# Patient Record
Sex: Male | Born: 1960
Health system: Southern US, Community
[De-identification: ages and names within clinical notes are randomized; demographics above are authoritative.]

## PROBLEM LIST (undated history)

## (undated) DIAGNOSIS — F419 Anxiety disorder, unspecified: Secondary | ICD-10-CM

## (undated) DIAGNOSIS — F32A Depression, unspecified: Secondary | ICD-10-CM

## (undated) DIAGNOSIS — K219 Gastro-esophageal reflux disease without esophagitis: Secondary | ICD-10-CM

## (undated) DIAGNOSIS — E039 Hypothyroidism, unspecified: Secondary | ICD-10-CM

## (undated) DIAGNOSIS — T7840XA Allergy, unspecified, initial encounter: Secondary | ICD-10-CM

## (undated) DIAGNOSIS — E785 Hyperlipidemia, unspecified: Secondary | ICD-10-CM

## (undated) DIAGNOSIS — F329 Major depressive disorder, single episode, unspecified: Secondary | ICD-10-CM

## (undated) DIAGNOSIS — M1990 Unspecified osteoarthritis, unspecified site: Secondary | ICD-10-CM

## (undated) HISTORY — PX: KNEE ARTHROSCOPY: SUR90

## (undated) HISTORY — PX: NASAL STENOSIS REPAIR: SHX2071

## (undated) HISTORY — PX: SEPTOPLASTY: SUR1290

## (undated) HISTORY — DX: Hyperlipidemia, unspecified: E78.5

## (undated) HISTORY — DX: Depression, unspecified: F32.A

## (undated) HISTORY — DX: Unspecified osteoarthritis, unspecified site: M19.90

## (undated) HISTORY — DX: Anxiety disorder, unspecified: F41.9

## (undated) HISTORY — DX: Gastro-esophageal reflux disease without esophagitis: K21.9

## (undated) HISTORY — DX: Allergy, unspecified, initial encounter: T78.40XA

## (undated) HISTORY — DX: Major depressive disorder, single episode, unspecified: F32.9

## (undated) HISTORY — DX: Hypothyroidism, unspecified: E03.9

## (undated) HISTORY — PX: ROTATOR CUFF REPAIR: SHX139

---

## 1999-02-04 ENCOUNTER — Encounter: Payer: Self-pay | Admitting: Internal Medicine

## 2004-04-30 ENCOUNTER — Ambulatory Visit: Payer: Self-pay | Admitting: Internal Medicine

## 2004-09-03 ENCOUNTER — Ambulatory Visit: Payer: Self-pay | Admitting: Orthopaedic Surgery

## 2004-09-23 ENCOUNTER — Ambulatory Visit: Payer: Self-pay | Admitting: Orthopaedic Surgery

## 2006-05-20 ENCOUNTER — Ambulatory Visit: Payer: Self-pay | Admitting: Orthopaedic Surgery

## 2006-06-01 ENCOUNTER — Ambulatory Visit: Payer: Self-pay | Admitting: Orthopaedic Surgery

## 2006-09-16 ENCOUNTER — Ambulatory Visit: Payer: Self-pay | Admitting: Orthopaedic Surgery

## 2006-10-20 ENCOUNTER — Ambulatory Visit: Payer: Self-pay | Admitting: Internal Medicine

## 2006-10-20 LAB — CONVERTED CEMR LAB: Glucose, Bld: 102 mg/dL — ABNORMAL HIGH (ref 70–99)

## 2007-01-27 ENCOUNTER — Telehealth (INDEPENDENT_AMBULATORY_CARE_PROVIDER_SITE_OTHER): Payer: Self-pay | Admitting: *Deleted

## 2007-04-13 ENCOUNTER — Telehealth (INDEPENDENT_AMBULATORY_CARE_PROVIDER_SITE_OTHER): Payer: Self-pay | Admitting: *Deleted

## 2007-04-21 ENCOUNTER — Ambulatory Visit: Payer: Self-pay | Admitting: Internal Medicine

## 2007-04-23 LAB — CONVERTED CEMR LAB
Barbiturate Quant, Ur: NEGATIVE
Cocaine Metabolites: NEGATIVE
Creatinine,U: 47.9 mg/dL
Phencyclidine (PCP): NEGATIVE
Propoxyphene: NEGATIVE

## 2007-04-29 ENCOUNTER — Telehealth (INDEPENDENT_AMBULATORY_CARE_PROVIDER_SITE_OTHER): Payer: Self-pay | Admitting: *Deleted

## 2007-04-30 ENCOUNTER — Ambulatory Visit: Payer: Self-pay | Admitting: Internal Medicine

## 2007-05-03 LAB — CONVERTED CEMR LAB: Rubella: <0.2 intl units/mL

## 2007-08-05 ENCOUNTER — Ambulatory Visit: Payer: Self-pay | Admitting: Internal Medicine

## 2007-08-26 ENCOUNTER — Ambulatory Visit: Payer: Self-pay | Admitting: Internal Medicine

## 2007-10-15 ENCOUNTER — Ambulatory Visit: Payer: Self-pay | Admitting: Internal Medicine

## 2008-02-02 ENCOUNTER — Ambulatory Visit: Payer: Self-pay | Admitting: Internal Medicine

## 2008-02-02 DIAGNOSIS — F39 Unspecified mood [affective] disorder: Secondary | ICD-10-CM | POA: Insufficient documentation

## 2008-02-02 DIAGNOSIS — M1990 Unspecified osteoarthritis, unspecified site: Secondary | ICD-10-CM | POA: Insufficient documentation

## 2008-02-02 DIAGNOSIS — J309 Allergic rhinitis, unspecified: Secondary | ICD-10-CM | POA: Insufficient documentation

## 2008-02-02 DIAGNOSIS — R5383 Other fatigue: Secondary | ICD-10-CM

## 2008-02-02 DIAGNOSIS — K219 Gastro-esophageal reflux disease without esophagitis: Secondary | ICD-10-CM

## 2008-02-02 DIAGNOSIS — J45909 Unspecified asthma, uncomplicated: Secondary | ICD-10-CM

## 2008-02-02 DIAGNOSIS — R5381 Other malaise: Secondary | ICD-10-CM | POA: Insufficient documentation

## 2008-02-03 LAB — CONVERTED CEMR LAB
Sex Hormone Binding: 32 nmol/L (ref 13–71)
Testosterone Free: 53.5 pg/mL (ref 47.0–244.0)
Testosterone-% Free: 2 % (ref 1.6–2.9)
Testosterone: 270 ng/dL — ABNORMAL LOW (ref 350–890)

## 2008-02-04 LAB — CONVERTED CEMR LAB
Alkaline Phosphatase: 48 units/L (ref 39–117)
BUN: 17 mg/dL (ref 6–23)
Basophils Absolute: 0 10*3/uL (ref 0.0–0.1)
Bilirubin, Direct: 0.1 mg/dL (ref 0.0–0.3)
CO2: 27 meq/L (ref 19–32)
Chloride: 100 meq/L (ref 96–112)
Creatinine, Ser: 1.2 mg/dL (ref 0.4–1.5)
Eosinophils Absolute: 0.1 10*3/uL (ref 0.0–0.7)
Eosinophils Relative: 0.8 % (ref 0.0–5.0)
GFR calc Af Amer: 83 mL/min
Glucose, Bld: 96 mg/dL (ref 70–99)
HCT: 46.2 % (ref 39.0–52.0)
MCV: 94.8 fL (ref 78.0–100.0)
Monocytes Absolute: 0.5 10*3/uL (ref 0.1–1.0)
Neutrophils Relative %: 76.4 % (ref 43.0–77.0)
Platelets: 223 10*3/uL (ref 150–400)
Potassium: 4.4 meq/L (ref 3.5–5.1)
RDW: 12.2 % (ref 11.5–14.6)
TSH: 0.91 microintl units/mL (ref 0.35–5.50)
Total Bilirubin: 1.2 mg/dL (ref 0.3–1.2)
Total Protein: 7.5 g/dL (ref 6.0–8.3)
WBC: 8.9 10*3/uL (ref 4.5–10.5)

## 2008-05-02 ENCOUNTER — Encounter: Payer: Self-pay | Admitting: Internal Medicine

## 2008-06-14 ENCOUNTER — Telehealth: Payer: Self-pay | Admitting: Internal Medicine

## 2008-09-28 ENCOUNTER — Ambulatory Visit: Payer: Self-pay | Admitting: Otolaryngology

## 2008-10-31 ENCOUNTER — Telehealth: Payer: Self-pay | Admitting: Internal Medicine

## 2008-10-31 ENCOUNTER — Ambulatory Visit: Payer: Self-pay | Admitting: Internal Medicine

## 2008-11-14 ENCOUNTER — Ambulatory Visit: Payer: Self-pay | Admitting: Internal Medicine

## 2008-11-14 ENCOUNTER — Telehealth: Payer: Self-pay | Admitting: Internal Medicine

## 2008-11-16 ENCOUNTER — Encounter (INDEPENDENT_AMBULATORY_CARE_PROVIDER_SITE_OTHER): Payer: Self-pay | Admitting: *Deleted

## 2008-11-16 LAB — CONVERTED CEMR LAB: Varicella IgG: 2.12 — ABNORMAL HIGH

## 2008-12-20 ENCOUNTER — Telehealth: Payer: Self-pay | Admitting: Internal Medicine

## 2009-02-05 ENCOUNTER — Telehealth: Payer: Self-pay | Admitting: Internal Medicine

## 2009-03-01 ENCOUNTER — Ambulatory Visit: Payer: Self-pay | Admitting: Internal Medicine

## 2009-03-01 DIAGNOSIS — F3289 Other specified depressive episodes: Secondary | ICD-10-CM | POA: Insufficient documentation

## 2009-03-01 DIAGNOSIS — F329 Major depressive disorder, single episode, unspecified: Secondary | ICD-10-CM

## 2009-04-17 ENCOUNTER — Ambulatory Visit: Payer: Self-pay | Admitting: Internal Medicine

## 2009-04-18 LAB — CONVERTED CEMR LAB
BUN: 20 mg/dL (ref 6–23)
Basophils Absolute: 0 10*3/uL (ref 0.0–0.1)
Chloride: 101 meq/L (ref 96–112)
Creatinine, Ser: 1 mg/dL (ref 0.4–1.5)
Eosinophils Absolute: 0.1 10*3/uL (ref 0.0–0.7)
GFR calc non Af Amer: 84.67 mL/min (ref >60)
Hemoglobin: 16.3 g/dL (ref 13.0–17.0)
Lymphocytes Relative: 35.9 % (ref 12.0–46.0)
MCHC: 34.7 g/dL (ref 30.0–36.0)
Monocytes Relative: 7.3 % (ref 3.0–12.0)
Neutro Abs: 2.6 10*3/uL (ref 1.4–7.7)
Phosphorus: 3.8 mg/dL (ref 2.3–4.6)
Platelets: 167 10*3/uL (ref 150.0–400.0)
RDW: 11.9 % (ref 11.5–14.6)
Sodium: 140 meq/L (ref 135–145)
TSH: 1.2 microintl units/mL (ref 0.35–5.50)

## 2009-05-31 DIAGNOSIS — D239 Other benign neoplasm of skin, unspecified: Secondary | ICD-10-CM

## 2009-05-31 HISTORY — DX: Other benign neoplasm of skin, unspecified: D23.9

## 2009-06-01 ENCOUNTER — Telehealth: Payer: Self-pay | Admitting: Internal Medicine

## 2009-06-30 DIAGNOSIS — E039 Hypothyroidism, unspecified: Secondary | ICD-10-CM

## 2009-06-30 HISTORY — DX: Hypothyroidism, unspecified: E03.9

## 2009-11-06 ENCOUNTER — Telehealth: Payer: Self-pay | Admitting: Internal Medicine

## 2009-12-10 ENCOUNTER — Ambulatory Visit: Payer: Self-pay | Admitting: Internal Medicine

## 2009-12-24 ENCOUNTER — Telehealth: Payer: Self-pay | Admitting: Internal Medicine

## 2010-01-07 ENCOUNTER — Telehealth: Payer: Self-pay | Admitting: Internal Medicine

## 2010-02-05 ENCOUNTER — Ambulatory Visit: Payer: Self-pay | Admitting: Internal Medicine

## 2010-02-11 ENCOUNTER — Ambulatory Visit: Payer: Self-pay | Admitting: Internal Medicine

## 2010-04-09 ENCOUNTER — Ambulatory Visit: Payer: Self-pay | Admitting: Internal Medicine

## 2010-04-25 ENCOUNTER — Telehealth: Payer: Self-pay | Admitting: Internal Medicine

## 2010-04-30 ENCOUNTER — Ambulatory Visit: Payer: Self-pay | Admitting: Internal Medicine

## 2010-04-30 DIAGNOSIS — R109 Unspecified abdominal pain: Secondary | ICD-10-CM | POA: Insufficient documentation

## 2010-07-05 ENCOUNTER — Encounter: Payer: Self-pay | Admitting: Family Medicine

## 2010-07-30 NOTE — Assessment & Plan Note (Signed)
Summary: TB TEST/Coralynn Gaona/CLE  Nurse Visit   Allergies: No Known Drug Allergies  Immunizations Administered:  PPD Skin Test:    Vaccine Type: PPD    Site: left forearm    Mfr: Sanofi Pasteur    Dose: 0.1 ml    Route: ID    Given by: Sydell Axon LPN    Exp. Date: 04/12/2011    Lot #: [id number]  PPD Results    Date of reading: 02/08/2010    Results: < 5mm    Interpretation: negative  Orders Added: 1)  TB Skin Test [86580] 2)  Admin 1st Vaccine [45409]

## 2010-07-30 NOTE — Progress Notes (Signed)
Summary: wants lab work  Phone Note Call from Patient Call back at [employer] [phone]   Caller: Patient Call For: Cindee Salt MD Summary of Call: Pt is asking if he can have lab work to check for ciliac disease.   Initial call taken by: Lowella Petties CMA, AAMA,  April 25, 2010 1:11 PM  Follow-up for Phone Call        okay to set up check for tissue transglutaminase and anti gliadin IgA)  Dx   789.00 Follow-up by: Cindee Salt MD,  April 25, 2010 1:20 PM  Additional Follow-up for Phone Call Additional follow up Details #1::        [facility] for pt to call.           Lowella Petties CMA, AAMA  April 26, 2010 10:20 AM  Lab appt made. Additional Follow-up by: Lowella Petties CMA, AAMA,  April 30, 2010 11:00 AM

## 2010-07-30 NOTE — Assessment & Plan Note (Signed)
Summary: flu shot/letvak/dlo  Nurse Visit   Allergies: 1)  ! Augmentin (Amoxicillin-Pot Clavulanate) 2)  ! * Peanuts  Orders Added: 1)  Admin 1st Vaccine [90471] 2)  Flu Vaccine 67yrs + [[address]]  Flu Vaccine Consent Questions     Do you have a history of severe allergic reactions to this vaccine? no    Any prior history of allergic reactions to egg and/or gelatin? no    Do you have a sensitivity to the preservative Thimersol? no    Do you have a past history of Guillan-Barre Syndrome? no    Do you currently have an acute febrile illness? no    Have you ever had a severe reaction to latex? no    Vaccine information given and explained to patient? yes    Are you currently pregnant? no    Lot Number:[id number]   Exp Date:12/28/2010   Site Given  Left Deltoid IM

## 2010-07-30 NOTE — Progress Notes (Signed)
Summary: Needs appt  Phone Note Outgoing Call   Call placed by: Mervin Hack CMA Duncan Dull),  Nov 06, 2009 1:05 PM Call placed to: Patient Summary of Call: called pt to advise that he needs to be seen for further refills, pt coming in today for ppd and he will schedule appt then. Will refill Initial call taken by: Mervin Hack CMA [facility]),  Nov 06, 2009 1:06 PM

## 2010-07-30 NOTE — Progress Notes (Signed)
Summary: Needs appt  Phone Note Call from Patient Call back at Home Phone [phone]   Caller: Patient Call For: Cindee Salt MD Summary of Call: patient calling to get refills, pt finally made appt on July 19 can I refill enough to last until that appt? normally pt makes appt get refill, then cancel appt. Also he wants rx for Prevacid, I advised that insurance may not coverPlease advise. Initial call taken by: Mervin Hack CMA [facility] Dull),  December 24, 2009 5:13 PM  Follow-up for Phone Call        okay to fill till appt #30 Follow-up by: Cindee Salt MD,  December 24, 2009 5:23 PM  Additional Follow-up for Phone Call Additional follow up Details #1::        Sent in prevacid, advised pt. Additional Follow-up by: Lowella Petties CMA,  December 25, 2009 2:51 PM    New/Updated Medications: PREVACID 30 MG CPDR (LANSOPRAZOLE) take one by mouth daily Prescriptions: LANSOPRAZOLE 30 MG CPDR (LANSOPRAZOLE) take 1 by mouth once daily  #30 x 0   Entered by:   Lowella Petties CMA   Authorized by:   Cindee Salt MD   Signed by:   Lowella Petties CMA on 12/25/2009   Method used:   Electronically to        [facility] [address]* (retail)       [address] [place], [place]  [address]       Ph: [phone]       Fax: [phone]   RxID:   [id number]   Prior Medications: LANSOPRAZOLE 30 MG CPDR (LANSOPRAZOLE) take 1 by mouth once daily VENTOLIN HFA 108 (90 BASE) MCG/ACT AERS (ALBUTEROL SULFATE) 2 puffs three times a day as needed for asthma problems CITALOPRAM HYDROBROMIDE 20 MG TABS (CITALOPRAM HYDROBROMIDE) 1 tab daily for anxiety and depression ANDROGEL 25 MG/2.5GM GEL (TESTOSTERONE) use once daily PREVACID 30 MG CPDR (LANSOPRAZOLE) take one by mouth daily Current Allergies: No known allergies

## 2010-07-30 NOTE — Assessment & Plan Note (Signed)
Summary: REFILL MEDICATION/CLE   Vital Signs:  Patient profile:   50 year old male Weight:      238 pounds BMI:     34.27 Temp:     98.6 degrees F oral Pulse rate:   64 / minute Pulse rhythm:   regular BP sitting:   128 / 80  (left arm) Cuff size:   large  Vitals Entered By: Mervin Hack CMA [facility]) (February 11, 2010 2:57 PM) CC: medication refill   History of Present Illness: Has been doing okay Continues on the citalopram one daily Had tried going off it briefly--for 3 days then multiple stressors and he really got worse  restarted and has been doing okay No depression  Still with knee pain he finds it limits his exercise  Asthma has been quiet  Reflux is quiet still on prevacid and that controls his symptoms His intestines do make a lot of noise then he gets gas has tried to cut out milk which may help some  started on pravachol for mildly elevated LDL Dr Gwen Pounds prescribed this  Allergies (verified): 1)  ! Augmentin (Amoxicillin-Pot Clavulanate) 2)  ! * Peanuts  Past History:  Past medical, surgical, family and social histories (including risk factors) reviewed for relevance to current acute and chronic problems.  Past Medical History: Reviewed history from 04/17/2009 and no changes required. Allergic rhinitis Anxiety Asthma GERD Osteoarthritis Depression Low testosterone--------------------------Dr Cope  Past Surgical History: Reviewed history from 03/01/2009 and no changes required. Rotator cuff surgeries---left 12/07, right 3/05 ([facility]) Right knee arthroscopy Septoplasty and nasal repair--4/10  Family History: Reviewed history from 03/01/2009 and no changes required. Mom died of cervical cancer @72  Dad died of renal failure @78 . Had  DM,HTN, CAD, had defibrillator 1 brother--obese Cancer on mom's side No colon or prostate cancer  Social History: Reviewed history from 03/01/2009 and no changes required. 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   Neck:  supple, no masses, no thyromegaly, no carotid bruits, and no cervical lymphadenopathy.   Lungs:  normal respiratory effort, no intercostal retractions, no accessory muscle use, and normal breath sounds.   Heart:  normal rate, regular rhythm, no murmur, and no gallop.   Abdomen:  soft, non-tender, and no masses.   Msk:  no joint tenderness and no joint swelling.   Pulses:  1+ in feet Extremities:  no edema Psych:  normally interactive, good eye contact, not anxious appearing, and not depressed appearing.     Impression & Recommendations:  Problem # 1:  ANXIETY (ICD-300.00) Assessment Unchanged doing okay on meds no changes  His updated medication list for this problem includes:    Citalopram Hydrobromide 20 Mg Tabs (Citalopram hydrobromide) .Marland Kitchen... 1 tab daily for anxiety and depression  Problem # 2:  OSTEOARTHRITIS (ICD-715.90) Assessment: Unchanged knees limit him some no regular meds though needs to work on fitness  Problem # 3:  ASTHMA (ICD-493.90) Assessment: Unchanged mild and intermittent  His updated medication list for this problem includes:    Ventolin Hfa 108 (90 Base) Mcg/act Aers (Albuterol sulfate) .Marland Kitchen... 2 puffs three times a day as needed for  asthma problems  Problem # 4:  GERD (ICD-530.81) Assessment: Unchanged okay on the meds  The following medications were removed from the medication list:    Prevacid 30 Mg Cpdr (Lansoprazole) .Marland Kitchen... Take one by mouth daily His updated medication list for this problem includes:    Lansoprazole 30 Mg Cpdr (Lansoprazole) .Marland Kitchen...  Take 1 by mouth once daily  Complete Medication List: 1)  Lansoprazole 30 Mg Cpdr (Lansoprazole) .... Take 1 by mouth once daily 2)  Ventolin Hfa 108 (90 Base) Mcg/act Aers (Albuterol sulfate) .... 2 puffs three times a day as needed for asthma problems 3)  Citalopram Hydrobromide 20 Mg Tabs (Citalopram hydrobromide) .Marland Kitchen.. 1 tab daily for anxiety and depression 4)  Androgel 25 Mg/2.5gm Gel (Testosterone) .... Use once daily 5)  Pravachol 40 Mg Tabs (Pravastatin sodium) .... Take 1 by mouth once daily  Patient Instructions: 1)  Please schedule a follow-up appointment in 6 months for physical Prescriptions: CITALOPRAM HYDROBROMIDE 20 MG TABS (CITALOPRAM HYDROBROMIDE) 1 tab daily for anxiety and depression  #30 x 12   Entered and Authorized by:   Cindee Salt MD   Signed by:   Cindee Salt MD on 02/11/2010   Method used:   Electronically to        [facility] [address] (retail)       [address] [place], [place]  [address]       Ph: [phone]       Fax: [phone]   RxID:   [id number] VENTOLIN HFA 108 (90 BASE) MCG/ACT AERS (ALBUTEROL SULFATE) 2 puffs three times a day as needed for asthma problems  #18 Gram x 3   Entered by:   Mervin Hack CMA (AAMA)   Authorized by:   Cindee Salt MD   Signed by:   Mervin Hack CMA (AAMA) on 02/11/2010   Method used:   Electronically to        [facility] [address]* (retail)       [address] [place], [place]  [address]       Ph: [phone]       Fax: [phone]   RxID:   [id number] LANSOPRAZOLE 30 MG CPDR (LANSOPRAZOLE) take 1 by mouth once daily  #90 x 3   Entered by:   Mervin Hack CMA (AAMA)   Authorized by:   Cindee Salt MD   Signed by:   Mervin Hack CMA (AAMA) on 02/11/2010   Method used:   Electronically to        [facility] [address] (retail)       [address] [place], [place]  [address]       Ph: [phone]       Fax: [phone]   RxID:   [id number]   Current Allergies (reviewed today): ! AUGMENTIN (AMOXICILLIN-POT CLAVULANATE) ! * PEANUTS

## 2010-07-30 NOTE — Progress Notes (Signed)
Summary:  CITALOPRAM   Phone Note Refill Request Message from:  [facility] #[id number] on January 07, 2010 12:31 PM  Refills Requested: Medication #1:  CITALOPRAM HYDROBROMIDE 20 MG TABS 1 tab daily for anxiety and depression   Last Refilled: 11/06/2009 E-Scribe Request pt has appt 01/15/2010   Method Requested: Electronic Initial call taken by: Mervin Hack CMA Duncan Dull),  January 07, 2010 12:31 PM  Follow-up for Phone Call        okay to fill #30 x 0 Follow-up by: Cindee Salt MD,  January 07, 2010 2:00 PM  Additional Follow-up for Phone Call Additional follow up Details #1::        Rx faxed to pharmacy Additional Follow-up by: DeShannon Smith CMA Duncan Dull),  January 07, 2010 3:25 PM    Prescriptions: CITALOPRAM HYDROBROMIDE 20 MG TABS (CITALOPRAM HYDROBROMIDE) 1 tab daily for anxiety and depression  #30 Tablet x 0   Entered by:   Mervin Hack CMA (AAMA)   Authorized by:   Cindee Salt MD   Signed by:   Mervin Hack CMA (AAMA) on 01/07/2010   Method used:   Electronically to        [facility] [address] [phone]* (retail)       [address] [place], [place]  [id number]       Ph: [phone]       Fax: [phone]   RxID:   [id number]

## 2010-07-30 NOTE — Assessment & Plan Note (Signed)
Summary: TB TEST/[facility]  Nurse Visit   Allergies: No Known Drug Allergies  Immunizations Administered:  PPD Skin Test:    Vaccine Type: PPD    Site: right forearm    Mfr: Sanofi Pasteur    Dose: 0.1 ml    Route: ID    Given by: Lowella Petties CMA    Exp. Date: 04/12/2011    Lot #: [id number]  Orders Added: 1)  TB Skin Test [86580] 2)  Admin 1st Vaccine [phone]

## 2010-08-12 ENCOUNTER — Encounter: Payer: Self-pay | Admitting: Internal Medicine

## 2010-08-12 ENCOUNTER — Encounter (INDEPENDENT_AMBULATORY_CARE_PROVIDER_SITE_OTHER): Payer: BC Managed Care – PPO | Admitting: Internal Medicine

## 2010-08-12 ENCOUNTER — Other Ambulatory Visit: Payer: Self-pay | Admitting: Internal Medicine

## 2010-08-12 DIAGNOSIS — Z0000 Encounter for general adult medical examination without abnormal findings: Secondary | ICD-10-CM

## 2010-08-12 DIAGNOSIS — R5381 Other malaise: Secondary | ICD-10-CM

## 2010-08-12 DIAGNOSIS — R5383 Other fatigue: Secondary | ICD-10-CM

## 2010-08-12 LAB — CBC WITH DIFFERENTIAL/PLATELET
Basophils Relative: 0.6 % (ref 0.0–3.0)
Eosinophils Relative: 1.3 % (ref 0.0–5.0)
HCT: 44.5 % (ref 39.0–52.0)
Hemoglobin: 15.6 g/dL (ref 13.0–17.0)
Lymphs Abs: 1.8 10*3/uL (ref 0.7–4.0)
Monocytes Relative: 6 % (ref 3.0–12.0)
Neutro Abs: 3.4 10*3/uL (ref 1.4–7.7)
RBC: 4.79 Mil/uL (ref 4.22–5.81)
WBC: 5.7 10*3/uL (ref 4.5–10.5)

## 2010-08-12 LAB — HEPATIC FUNCTION PANEL
ALT: 39 U/L (ref 0–53)
AST: 27 U/L (ref 0–37)
Albumin: 4.2 g/dL (ref 3.5–5.2)
Alkaline Phosphatase: 39 U/L (ref 39–117)
Total Protein: 6.6 g/dL (ref 6.0–8.3)

## 2010-08-12 LAB — RENAL FUNCTION PANEL
CO2: 29 mEq/L (ref 19–32)
Chloride: 97 mEq/L (ref 96–112)
GFR: 90.44 mL/min (ref >60.00)
Sodium: 139 mEq/L (ref 135–145)

## 2010-08-12 LAB — TSH: TSH: 0.98 u[IU]/mL (ref 0.35–5.50)

## 2010-08-21 NOTE — Assessment & Plan Note (Signed)
Summary: CPE/JRR   Vital Signs:  Patient profile:   50 year old male Height:      70 inches Weight:      241 pounds Temp:     98.7 degrees F oral Pulse rate:   71 / minute Pulse rhythm:   regular BP sitting:   134 / 76  (left arm) Cuff size:   large  Vitals Entered By: Mervin Hack CMA Duncan Dull) (August 12, 2010 11:53 AM) CC: adult physical   History of Present Illness: Doing okay Has felt "exhaused" lately Lost some weight before Christmas----gained it all back since due to lack of time to exercise  Has continued with Dr Achilles Dunk Ongoing Rx for hypogonadism Now on injections---- level is up but he doesn't feel any different yet  Ongoing stomach trouble lots of gas and bloating Celiac testing was negative Loud gurgling--after eating. Gets bloating and discomfort tends to be constipated  Allergies: 1)  ! Augmentin (Amoxicillin-Pot Clavulanate) 2)  ! * Peanuts  Past History:  Past medical, surgical, family and social histories (including risk factors) reviewed for relevance to current acute and chronic problems.  Past Medical History: Reviewed history from 04/17/2009 and no changes required. Allergic rhinitis Anxiety Asthma GERD Osteoarthritis Depression Low testosterone--------------------------Dr Cope  Past Surgical History: Reviewed history from 03/01/2009 and no changes required. Rotator cuff surgeries---left 12/07, right 3/05 ([facility]) Right knee arthroscopy Septoplasty and nasal repair--4/10  Family History: Reviewed history from 03/01/2009 and no changes required. Mom died of cervical cancer @72  Dad died of renal failure @78 . Had  DM,HTN, CAD, had defibrillator 1 brother--obese Cancer on mom's side No colon or prostate cancer  Social History: Reviewed history from 03/01/2009 and no changes required. 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 Eyes:  Denies double vision and vision loss-1 eye. ENT:  Denies decreased hearing and ringing in ears; teeth okay--regular with dentist. CV:  Complains of lightheadness; denies chest pain or discomfort, difficulty breathing at night, difficulty breathing while lying down, fainting, palpitations, and shortness of breath with exertion; occ mild dizziness--non specific. Resp:  Denies cough, shortness of breath, and wheezing. GI:  Complains of constipation and gas; denies abdominal pain, bloody stools, dark tarry stools, nausea, and vomiting. GU:  Complains of erectile dysfunction; denies nocturia, urinary frequency, and urinary hesitancy; happy with daily cialis No urinary symptoms. MS:  Complains of joint pain; denies joint swelling; trouble lifting due to shoulder pain Hard to do cardio due to knee pain---has gotten injections. Derm:  Denies lesion(s) and rash; regular derm evals--Dr Gwen Pounds. Neuro:  Denies headaches, numbness, tingling, and weakness. Psych:  Denies anxiety and depression; mood has been fine weaned off citalopram. Heme:  Denies abnormal bruising and enlarge lymph nodes. Allergy:  Denies seasonal allergies and sneezing.  Physical Exam  General:  alert and normal appearance.   Eyes:  pupils equal, pupils round, pupils reactive to light, and no optic disk abnormalities.   Ears:  R ear normal and L ear normal.   Mouth:  no erythema, no exudates, and no lesions.   Neck:  supple, no masses, no thyromegaly, no carotid bruits, and no cervical lymphadenopathy.   Lungs:  normal respiratory effort, no intercostal retractions, no accessory muscle use, and normal breath sounds.   Heart:  normal rate, regular rhythm, no murmur, and no gallop.   Abdomen:  soft and non-tender.   Msk:  no joint tenderness and no joint swelling.   Pulses:  2+ in feet Extremities:  no edema Neurologic:   alert & oriented X3, strength normal in all extremities, and gait normal.   Skin:  no rashes and no suspicious lesions.   Axillary Nodes:  No palpable lymphadenopathy Psych:  normally interactive, good eye contact, not anxious appearing, and not depressed appearing.     Impression & Recommendations:  Problem # 1:  EXAMINATION, ROUTINE MEDICAL (ICD-V70.0) Assessment Comment Only healthy but out of shape limited by arthritis now  discussed  Problem # 2:  ABDOMINAL PAIN, UNSPECIFIED SITE (ICD-789.00) Assessment: Deteriorated  more gas, more discomfort constipation prone IBS most likely will refer to GI--esp since he is almost 50  Orders: Gastroenterology Referral (GI)  Problem # 3:  DEPRESSION (ICD-311) Assessment: Improved doing okay without meds now  The following medications were removed from the medication list:    Citalopram Hydrobromide 20 Mg Tabs (Citalopram hydrobromide) .Marland Kitchen... 1 tab daily for anxiety and depression  Problem # 4:  ASTHMA (ICD-493.90) Assessment: Unchanged quiet hasn't needed inhaler  His updated medication list for this problem includes:    Ventolin Hfa 108 (90 Base) Mcg/act Aers (Albuterol sulfate) .Marland Kitchen... 2 puffs three times a day as needed for asthma problems  Complete Medication List: 1)  Lansoprazole 30 Mg Cpdr (Lansoprazole) .... Take 1 by mouth once daily 2)  Pravachol 40 Mg Tabs (Pravastatin sodium) .... Take 1 by mouth once daily 3)  Testosterone Powd (Testosterone) .... Patient injects two times a month 4)  Ventolin Hfa 108 (90 Base) Mcg/act Aers (Albuterol sulfate) .... 2 puffs three times a day as needed for asthma problems  Other Orders: TLB-Renal Function Panel (80069-RENAL) TLB-CBC Platelet - w/Differential (85025-CBCD) TLB-TSH (Thyroid Stimulating Hormone) (84443-TSH) TLB-Hepatic/Liver Function Pnl (80076-HEPATIC) Venipuncture (04540)  Patient Instructions: 1)  Please schedule a follow-up appointment in 6 months .  2)  Referral  Appointment Information 3)  Day/Date: 4)  Time: 5)  Place/MD: 6)  Address: 7)  Phone/Fax: 8)  Patient given appointment information. Information/Orders faxed/mailed.   Orders Added: 1)  TLB-Renal Function Panel [80069-RENAL] 2)  TLB-CBC Platelet - w/Differential [85025-CBCD] 3)  TLB-TSH (Thyroid Stimulating Hormone) [84443-TSH] 4)  TLB-Hepatic/Liver Function Pnl [80076-HEPATIC] 5)  Venipuncture [36415] 6)  Est. Patient 40-64 years [99396] 7)  Gastroenterology Referral [GI]    Current Allergies (reviewed today): ! AUGMENTIN (AMOXICILLIN-POT CLAVULANATE) ! * PEANUTS

## 2010-08-28 ENCOUNTER — Ambulatory Visit: Payer: BC Managed Care – PPO | Admitting: Gastroenterology

## 2010-10-18 ENCOUNTER — Other Ambulatory Visit: Payer: Self-pay | Admitting: Internal Medicine

## 2010-12-27 ENCOUNTER — Other Ambulatory Visit: Payer: Self-pay | Admitting: Internal Medicine

## 2011-02-07 ENCOUNTER — Encounter: Payer: Self-pay | Admitting: Internal Medicine

## 2011-02-10 ENCOUNTER — Encounter: Payer: Self-pay | Admitting: Internal Medicine

## 2011-02-10 ENCOUNTER — Ambulatory Visit (INDEPENDENT_AMBULATORY_CARE_PROVIDER_SITE_OTHER): Payer: BC Managed Care – PPO | Admitting: Internal Medicine

## 2011-02-10 DIAGNOSIS — K219 Gastro-esophageal reflux disease without esophagitis: Secondary | ICD-10-CM

## 2011-02-10 DIAGNOSIS — E785 Hyperlipidemia, unspecified: Secondary | ICD-10-CM | POA: Insufficient documentation

## 2011-02-10 DIAGNOSIS — J45909 Unspecified asthma, uncomplicated: Secondary | ICD-10-CM

## 2011-02-10 DIAGNOSIS — F329 Major depressive disorder, single episode, unspecified: Secondary | ICD-10-CM

## 2011-02-10 MED ORDER — PREVACID 30 MG PO CPDR: 30.0000 mg | DELAYED_RELEASE_CAPSULE | Freq: Every day | ORAL | Status: DC

## 2011-02-10 NOTE — Assessment & Plan Note (Signed)
Doing well Continues on the citalopram

## 2011-02-10 NOTE — Assessment & Plan Note (Signed)
He stopped the pravachol With high HDL, has overall fairly low risk Will hold off on meds for now

## 2011-02-10 NOTE — Assessment & Plan Note (Signed)
Has failed trial with 2 different prevacid generics Will prescribe brand only

## 2011-02-10 NOTE — Assessment & Plan Note (Signed)
Quiet lately Hasn't needed rescue inhaler

## 2011-02-10 NOTE — Progress Notes (Signed)
Subjective:    Patient ID: Logan Wise, male    DOB: 1961/06/27, 50 y.o.   MRN: [id number]  HPI Has been working on fitness Has lost 24# by our scales Did have some back injury 2 weeks ago so has had to take a break  Mood has been better Still on citalopram---has been more consistent remembering this  Knee pain is better since working out more Chronic pain has been relieved Building quads with stationery bike  Has been going to [facility] Getting weekly   Given pravastatin by Dr Gwen Pounds Hasn't really been taking it LDL up some on recent BW at 146  Intestines have continued to bother him at times Feels he is gluten sensitive--seems to do better with bowels with cutting out gluten Takes probiotic also Continues on prevacid for GERD---he finds that the generic doesn't work as well  Current Outpatient Prescriptions on File Prior to Visit  Medication Sig Dispense Refill  . albuterol (VENTOLIN HFA) 108 (90 BASE) MCG/ACT inhaler Inhale 2 puffs into the lungs 3 (three) times daily.        . citalopram (CELEXA) 20 MG tablet take 1 tablet by mouth once daily for anxiety and depression  30 tablet  0  . lansoprazole (PREVACID) 30 MG capsule Take 30 mg by mouth daily.        . pravastatin (PRAVACHOL) 40 MG tablet Take 40 mg by mouth daily.        Marland Kitchen testosterone cypionate (DEPOTESTOTERONE CYPIONATE) 200 MG/ML injection Inject into the muscle every 14 (fourteen) days.          Allergies  Allergen Reactions  . WUJ:WJXBJYNWGNF+AOZHYQMVH+QIONGEXBMW Acid+Aspartame     REACTION: rash, hives  . Peanut-Containing Drug Products     Past Medical History  Diagnosis Date  . Allergy   . Anxiety   . Asthma   . GERD (gastroesophageal reflux disease)   . Arthritis   . Depression   . Low testosterone     Past Surgical History  Procedure Date  . Rotator cuff repair     left 12/07, right 3/05 ([facility])  . Knee arthroscopy     right  . Nasal stenosis repair   .  Septoplasty     Family History  Problem Relation Age of Onset  . Cancer Mother     cervical  . Diabetes Father   . Heart disease Father   . Hypertension Father     History   Social History  . Marital Status: Divorced    Spouse Name: N/A    Number of Children: 3  . Years of Education: N/A   Occupational History  . outside sales    Social History Main Topics  . Smoking status: Former Games developer  . Smokeless tobacco: Never Used  . Alcohol Use: Yes  . Drug Use: No  . Sexually Active: Not on file   Other Topics Concern  . Not on file   Social History Narrative  . No narrative on file   Review of Systems Sleeps okay No edema Hasn't needed his inhaler in a long time     Objective:   Physical Exam  Constitutional: He appears well-developed and well-nourished. No distress.  Neck: Normal range of motion. Neck supple. No thyromegaly present.  Cardiovascular: Normal rate, regular rhythm, normal heart sounds and intact distal pulses.  Exam reveals no gallop.   No murmur heard. Pulmonary/Chest: Effort normal and breath sounds normal. No respiratory distress. He has no wheezes. He has  no rales.  Abdominal: Soft. There is no tenderness.  Musculoskeletal: Normal range of motion. He exhibits no edema.  Lymphadenopathy:    He has no cervical adenopathy.  Psychiatric: He has a normal mood and affect. His behavior is normal. Judgment and thought content normal.          Assessment & Plan:

## 2011-02-21 ENCOUNTER — Other Ambulatory Visit: Payer: Self-pay | Admitting: Internal Medicine

## 2011-03-04 ENCOUNTER — Encounter: Payer: Self-pay | Admitting: Internal Medicine

## 2011-03-04 ENCOUNTER — Ambulatory Visit (INDEPENDENT_AMBULATORY_CARE_PROVIDER_SITE_OTHER): Payer: BC Managed Care – PPO | Admitting: Internal Medicine

## 2011-03-04 VITALS — BP 147/69 | HR 63 | Temp 98.4°F | Ht 70.0 in | Wt 222.0 lb

## 2011-03-04 DIAGNOSIS — Z111 Encounter for screening for respiratory tuberculosis: Secondary | ICD-10-CM

## 2011-03-04 DIAGNOSIS — F900 Attention-deficit hyperactivity disorder, predominantly inattentive type: Secondary | ICD-10-CM | POA: Insufficient documentation

## 2011-03-04 DIAGNOSIS — F988 Other specified behavioral and emotional disorders with onset usually occurring in childhood and adolescence: Secondary | ICD-10-CM

## 2011-03-04 MED ORDER — AMPHETAMINE-DEXTROAMPHET ER 20 MG PO CP24: 20.0000 mg | ORAL_CAPSULE | ORAL | Status: DC

## 2011-03-04 NOTE — Assessment & Plan Note (Signed)
Clear diagnosis from the psychologist No other psychiatric or medical  diagnosis to account for symptoms Probably goes back to before he was 7  Will try trial of adderall Discussed side effects

## 2011-03-04 NOTE — Progress Notes (Signed)
  Subjective:    Patient ID: Logan Wise, male    DOB: 18-Jun-1961, 50 y.o.   MRN: [id number]  HPI Psychologist note reviewed Testing shows evidence of inattentive ADHD He notes life long troubles concentrating on a single task--goes back at least to grade school Starts new things and never finishes them  He has noted this in his work Affecting his ability to perform his job tasks Finds this increases his anxiety and frustration His wife has noted the same trouble at home as well  Current Outpatient Prescriptions on File Prior to Visit  Medication Sig Dispense Refill  . albuterol (VENTOLIN HFA) 108 (90 BASE) MCG/ACT inhaler Inhale 2 puffs into the lungs 3 (three) times daily.        . citalopram (CELEXA) 20 MG tablet take 1 tablet by mouth once daily for anxiety and depression  30 tablet  0  . PREVACID 30 MG capsule Take 1 capsule (30 mg total) by mouth daily.  30 capsule  11  . testosterone cypionate (DEPOTESTOTERONE CYPIONATE) 200 MG/ML injection Inject into the muscle every 14 (fourteen) days.          Allergies  Allergen Reactions  . WUJ:WJXBJYNWGNF+AOZHYQMVH+QIONGEXBMW Acid+Aspartame     REACTION: rash, hives  . Peanut-Containing Drug Products     Past Medical History  Diagnosis Date  . Allergy   . Anxiety   . Asthma   . GERD (gastroesophageal reflux disease)   . Arthritis   . Depression   . Low testosterone   . Hyperlipidemia     Past Surgical History  Procedure Date  . Rotator cuff repair     left 12/07, right 3/05 ([facility])  . Knee arthroscopy     right  . Nasal stenosis repair   . Septoplasty     Family History  Problem Relation Age of Onset  . Cancer Mother     cervical  . Diabetes Father   . Heart disease Father   . Hypertension Father     History   Social History  . Marital Status: Divorced    Spouse Name: N/A    Number of Children: 3  . Years of Education: N/A   Occupational History  . outside sales    Social History Main  Topics  . Smoking status: Former Games developer  . Smokeless tobacco: Never Used  . Alcohol Use: Yes  . Drug Use: No  . Sexually Active: Not on file   Other Topics Concern  . Not on file   Social History Narrative  . No narrative on file   Review of Systems Some sleep problems Appetite is "too good" Has put on 4# since last visit Tore calf muscle so limited with exercise    Objective:   Physical Exam  Constitutional: He appears well-developed and well-nourished. No distress.  Psychiatric: He has a normal mood and affect. His behavior is normal. Judgment and thought content normal.          Assessment & Plan:

## 2011-04-07 ENCOUNTER — Encounter: Payer: Self-pay | Admitting: Internal Medicine

## 2011-04-07 ENCOUNTER — Ambulatory Visit (INDEPENDENT_AMBULATORY_CARE_PROVIDER_SITE_OTHER): Payer: BC Managed Care – PPO | Admitting: Internal Medicine

## 2011-04-07 VITALS — BP 134/69 | HR 65 | Temp 97.8°F | Ht 70.0 in | Wt 227.0 lb

## 2011-04-07 DIAGNOSIS — F988 Other specified behavioral and emotional disorders with onset usually occurring in childhood and adolescence: Secondary | ICD-10-CM

## 2011-04-07 DIAGNOSIS — F900 Attention-deficit hyperactivity disorder, predominantly inattentive type: Secondary | ICD-10-CM

## 2011-04-07 MED ORDER — AMPHETAMINE-DEXTROAMPHET ER 10 MG PO CP24: 10.0000 mg | ORAL_CAPSULE | ORAL | Status: DC

## 2011-04-07 MED ORDER — AMPHETAMINE-DEXTROAMPHET ER 30 MG PO CP24: 30.0000 mg | ORAL_CAPSULE | ORAL | Status: DC

## 2011-04-07 NOTE — Assessment & Plan Note (Signed)
Some glimpses of effect from the med but not consistent No side effects Will increase to 30mg  Extra 10mg  rx to increase to 40 if not helping

## 2011-04-07 NOTE — Progress Notes (Signed)
  Subjective:    Patient ID: Logan Wise, male    DOB: December 16, 1960, 50 y.o.   MRN: [id number]  HPI Has done with the adderall He has seem some "glimpses of better focus" Able to do his tasks better at times--but not clear cut  No stomach trouble Careful about limiting gluten Appetite is okay Sleep is still an issue--no change with the med  Current Outpatient Prescriptions on File Prior to Visit  Medication Sig Dispense Refill  . albuterol (VENTOLIN HFA) 108 (90 BASE) MCG/ACT inhaler Inhale 2 puffs into the lungs 3 (three) times daily.        . citalopram (CELEXA) 20 MG tablet take 1 tablet by mouth once daily for anxiety and depression  30 tablet  0  . PREVACID 30 MG capsule Take 1 capsule (30 mg total) by mouth daily.  30 capsule  11  . testosterone cypionate (DEPOTESTOTERONE CYPIONATE) 200 MG/ML injection Inject into the muscle every 14 (fourteen) days.          Allergies  Allergen Reactions  . NWG:NFAOZHYQMVH+QIONGEXBM+WUXLKGMWNU Acid+Aspartame     REACTION: rash, hives  . Peanut-Containing Drug Products     Past Medical History  Diagnosis Date  . Allergy   . Anxiety   . Asthma   . GERD (gastroesophageal reflux disease)   . Arthritis   . Depression   . Low testosterone   . Hyperlipidemia     Past Surgical History  Procedure Date  . Rotator cuff repair     left 12/07, right 3/05 ([facility])  . Knee arthroscopy     right  . Nasal stenosis repair   . Septoplasty     Family History  Problem Relation Age of Onset  . Cancer Mother     cervical  . Diabetes Father   . Heart disease Father   . Hypertension Father     History   Social History  . Marital Status: Divorced    Spouse Name: N/A    Number of Children: 3  . Years of Education: N/A   Occupational History  . outside sales    Social History Main Topics  . Smoking status: Former Games developer  . Smokeless tobacco: Never Used  . Alcohol Use: Yes  . Drug Use: No  . Sexually Active: Not on file    Other Topics Concern  . Not on file   Social History Narrative  . No narrative on file   Review of Systems Has noted hyperhidrosis in past 2 months Feels hot after coffee or wine     Objective:   Physical Exam  Constitutional: He appears well-developed and well-nourished. No distress.  Psychiatric: He has a normal mood and affect. His behavior is normal. Judgment and thought content normal.          Assessment & Plan:

## 2011-04-07 NOTE — Patient Instructions (Signed)
Please try the 30 mg dose for 1-2 weeks. If still not having a consistent response, can increase to 40mg  (take a 30 and a 10mg  at the same time)

## 2011-04-17 ENCOUNTER — Telehealth: Payer: Self-pay | Admitting: *Deleted

## 2011-04-17 NOTE — Telephone Encounter (Signed)
Patient notified as instructed by telephone. Copy of lab results left up front for patient to pick up per his request.

## 2011-04-17 NOTE — Telephone Encounter (Signed)
Please send him copy of the titre He shouldn't need a letter as well---the lab report speaks for itself

## 2011-04-17 NOTE — Telephone Encounter (Signed)
Pt needs letter stating that he has immunity to chicken pox, needs this for his employer.  Last positive titre was on 11/14/08.  Please call him when ready.

## 2011-05-06 ENCOUNTER — Other Ambulatory Visit: Payer: Self-pay | Admitting: Internal Medicine

## 2011-05-07 ENCOUNTER — Other Ambulatory Visit: Payer: Self-pay | Admitting: Internal Medicine

## 2011-05-08 ENCOUNTER — Other Ambulatory Visit: Payer: Self-pay | Admitting: *Deleted

## 2011-05-08 MED ORDER — AMPHETAMINE-DEXTROAMPHET ER 20 MG PO CP24: 40.0000 mg | ORAL_CAPSULE | ORAL | Status: DC

## 2011-05-08 NOTE — Telephone Encounter (Signed)
Chart opened in error

## 2011-05-08 NOTE — Telephone Encounter (Signed)
Find out what dose he is taking I increased him to 30mg  but gave the extra 10mg  only just in case he needed more

## 2011-05-08 NOTE — Telephone Encounter (Signed)
Better to change to 20mg  size and take 2--then he only needs 1 Rx

## 2011-05-08 NOTE — Telephone Encounter (Signed)
Left message on machine that rx is ready for pick-up, and it will be at our front desk.  

## 2011-05-08 NOTE — Telephone Encounter (Signed)
Pt is taking 40 mg's a day, the 30 mg along with the 10 mg.  Pt needs the script for 10 mg's today, he is going out of town tomorrow.

## 2011-06-09 ENCOUNTER — Ambulatory Visit: Payer: BC Managed Care – PPO | Admitting: Internal Medicine

## 2011-06-09 DIAGNOSIS — Z0289 Encounter for other administrative examinations: Secondary | ICD-10-CM

## 2011-06-16 ENCOUNTER — Other Ambulatory Visit: Payer: Self-pay | Admitting: *Deleted

## 2011-06-16 NOTE — Telephone Encounter (Signed)
Please confirm dose I had increased him to 30mg   How much is he taking now?

## 2011-06-16 NOTE — Telephone Encounter (Signed)
Request for Adderall XR 20 mg [last refill 11.08.12 #60x0]

## 2011-06-17 ENCOUNTER — Telehealth: Payer: Self-pay | Admitting: Internal Medicine

## 2011-06-17 MED ORDER — AMPHETAMINE-DEXTROAMPHET ER 20 MG PO CP24: 40.0000 mg | ORAL_CAPSULE | ORAL | Status: DC

## 2011-06-17 NOTE — Telephone Encounter (Signed)
20 mg xr caps take 2 caps by mouth every morning

## 2011-06-17 NOTE — Telephone Encounter (Signed)
Given to patient in the lobby

## 2011-06-30 ENCOUNTER — Institutional Professional Consult (permissible substitution): Payer: BC Managed Care – PPO | Admitting: Cardiology

## 2011-07-06 ENCOUNTER — Other Ambulatory Visit: Payer: Self-pay | Admitting: Internal Medicine

## 2011-07-09 NOTE — Telephone Encounter (Signed)
Opened in error by Pam Clement 

## 2011-07-16 ENCOUNTER — Other Ambulatory Visit: Payer: Self-pay | Admitting: Internal Medicine

## 2011-07-18 ENCOUNTER — Other Ambulatory Visit: Payer: Self-pay | Admitting: Internal Medicine

## 2011-07-18 MED ORDER — AMPHETAMINE-DEXTROAMPHET ER 20 MG PO CP24: 40.0000 mg | ORAL_CAPSULE | ORAL | Status: DC

## 2011-07-18 NOTE — Telephone Encounter (Addendum)
Patient needs a refill on his Adderall. Please call patient when prescription is ready.

## 2011-07-18 NOTE — Telephone Encounter (Signed)
Spoke with patient and advised results   

## 2011-07-22 ENCOUNTER — Other Ambulatory Visit: Payer: Self-pay | Admitting: Internal Medicine

## 2011-07-22 MED ORDER — AMPHETAMINE-DEXTROAMPHET ER 20 MG PO CP24: 40.0000 mg | ORAL_CAPSULE | ORAL | Status: DC

## 2011-07-22 NOTE — Progress Notes (Signed)
Called to pick up script and not found out front Wasn't signed out Redid the Rx

## 2011-07-23 ENCOUNTER — Institutional Professional Consult (permissible substitution): Payer: BC Managed Care – PPO | Admitting: Cardiology

## 2011-08-11 ENCOUNTER — Ambulatory Visit (INDEPENDENT_AMBULATORY_CARE_PROVIDER_SITE_OTHER): Payer: 59 | Admitting: Cardiology

## 2011-08-11 ENCOUNTER — Encounter: Payer: Self-pay | Admitting: Cardiology

## 2011-08-11 DIAGNOSIS — E785 Hyperlipidemia, unspecified: Secondary | ICD-10-CM

## 2011-08-11 DIAGNOSIS — R011 Cardiac murmur, unspecified: Secondary | ICD-10-CM

## 2011-08-11 NOTE — Assessment & Plan Note (Signed)
He has evidence of iron overload based on his high hemoglobin and high ferritin levels. There is no evidence of cardiac manifestations which usually occur late in this disease. I think with appropriate therapy with phlebotomy it is unlikely that he will have significant cardiac manifestations. Given the murmur heard on exam we will obtain an echocardiogram. I have recommended that he pursue phlebotomy to achieve a normal ferritin level.

## 2011-08-11 NOTE — Assessment & Plan Note (Signed)
He has a relatively soft apical systolic murmur. We will obtain an echocardiogram to evaluate further.

## 2011-08-11 NOTE — Patient Instructions (Signed)
We will schedule you for an echocardiogram.  Proceed with periodic phlebotomy.

## 2011-08-11 NOTE — Progress Notes (Signed)
Logan Wise Date of Birth: March 26, 1961 Medical Record #[id number]  History of Present Illness: Mr. Logan Wise is seen at the request of Dr. Consuelo Pandy for cardiac evaluation with recent diagnoses of iron overload. He is a 51 year old white male who in general has been in good health. On recent evaluation he was noted to have an elevated hemoglobin of 18. Further evaluation revealed a high serum ferritin of 673. He denies any cardiac symptoms. He specifically denies any chest pain, shortness of breath, edema, or palpitations. He does have a history of a heart murmur all his life and has never been formally evaluated. He has not started phlebotomy therapy.  Current Outpatient Prescriptions on File Prior to Visit  Medication Sig Dispense Refill  . albuterol (VENTOLIN HFA) 108 (90 BASE) MCG/ACT inhaler Inhale 2 puffs into the lungs 3 (three) times daily.        Marland Kitchen amphetamine-dextroamphetamine (ADDERALL XR) 20 MG 24 hr capsule Take 2 capsules (40 mg total) by mouth every morning.  60 capsule  0  . citalopram (CELEXA) 20 MG tablet take 1 tablet by mouth once daily for anxiety and depression  30 tablet  2  . testosterone cypionate (DEPOTESTOTERONE CYPIONATE) 200 MG/ML injection Inject into the muscle every 14 (fourteen) days.          Allergies  Allergen Reactions  . HYQ:MVHQIONGEXB+MWUXLKGMW+NUUVOZDGUY Acid+Aspartame     REACTION: rash, hives  . Peanut-Containing Drug Products     Past Medical History  Diagnosis Date  . Allergy   . Anxiety   . Asthma   . GERD (gastroesophageal reflux disease)   . Arthritis   . Depression   . Low testosterone   . Hyperlipidemia     Past Surgical History  Procedure Date  . Rotator cuff repair     left 12/07, right 3/05 ([facility])  . Knee arthroscopy     right  . Nasal stenosis repair   . Septoplasty     History  Smoking status  . Former Smoker  Smokeless tobacco  . Never Used    History  Alcohol Use  . Yes    Family History  Problem  Relation Age of Onset  . Cancer Mother     cervical  . Diabetes Father   . Heart disease Father   . Hypertension Father     Review of Systems: As noted in history of present illness..  All other systems were reviewed and are negative.  Physical Exam: BP 118/72  Pulse 63  Ht 6' (1.829 m)  Wt 212 lb 12.8 oz (96.525 kg)  BMI 28.86 kg/m2 The patient is alert and oriented x 3.  The mood and affect are normal.  The skin is warm and dry.  Color is normal.  The HEENT exam reveals that the sclera are nonicteric.  The mucous membranes are moist.  The carotids are 2+ without bruits.  There is no thyromegaly.  There is no JVD.  The lungs are clear.  The chest wall is non tender.  The heart exam reveals a regular rate with a normal S1 and S2.  There is a soft systolic murmur at the apex without radiation.  The PMI is not displaced.   Abdominal exam reveals good bowel sounds.  There is no guarding or rebound.  There is no hepatosplenomegaly or tenderness.  There are no masses.  Exam of the legs reveal no clubbing, cyanosis, or edema.  The legs are without rashes.  The distal pulses are intact.  Cranial nerves II - XII are intact.  Motor and sensory functions are intact.  The gait is normal.  LABORATORY DATA: ECG today demonstrates normal sinus rhythm with poor R-wave progression V1 through V3. It is otherwise normal. His most recent lipid parameters showed a total cholesterol 234, triglycerides 101, HDL 60, LDL 154.  Assessment / Plan:

## 2011-08-11 NOTE — Assessment & Plan Note (Signed)
He has modest cholesterol elevation. I recommend initial therapy with lifestyle modification including regular aerobic exercise and a heart healthy diet. I would reassess once his iron overload has been treated.

## 2011-08-12 NOTE — Progress Notes (Signed)
Discussed the hemachromatosis diagnosis He is unaware of FH Unsure whether his testosterone could be adding to this or even causing iron overload  Recommended blood donation every 8 weeks and we can recheck labs after 3 donations. May not need medical phlebotomy

## 2011-08-15 ENCOUNTER — Ambulatory Visit: Payer: BC Managed Care – PPO | Admitting: Internal Medicine

## 2011-08-18 ENCOUNTER — Ambulatory Visit (HOSPITAL_COMMUNITY): Payer: 59 | Attending: Cardiology | Admitting: Radiology

## 2011-08-18 ENCOUNTER — Other Ambulatory Visit: Payer: Self-pay

## 2011-08-18 DIAGNOSIS — I379 Nonrheumatic pulmonary valve disorder, unspecified: Secondary | ICD-10-CM | POA: Insufficient documentation

## 2011-08-18 DIAGNOSIS — I079 Rheumatic tricuspid valve disease, unspecified: Secondary | ICD-10-CM | POA: Insufficient documentation

## 2011-08-18 DIAGNOSIS — R011 Cardiac murmur, unspecified: Secondary | ICD-10-CM | POA: Insufficient documentation

## 2011-08-18 DIAGNOSIS — E785 Hyperlipidemia, unspecified: Secondary | ICD-10-CM | POA: Insufficient documentation

## 2011-08-19 ENCOUNTER — Other Ambulatory Visit: Payer: Self-pay | Admitting: Internal Medicine

## 2011-08-19 NOTE — Telephone Encounter (Signed)
rx sent to pharmacy by e-script  

## 2011-08-19 NOTE — Telephone Encounter (Signed)
Okay to refill for 1 year. 

## 2011-08-22 ENCOUNTER — Ambulatory Visit: Payer: BC Managed Care – PPO | Admitting: Internal Medicine

## 2011-08-25 ENCOUNTER — Encounter: Payer: Self-pay | Admitting: Internal Medicine

## 2011-08-25 ENCOUNTER — Ambulatory Visit (INDEPENDENT_AMBULATORY_CARE_PROVIDER_SITE_OTHER): Payer: 59 | Admitting: Internal Medicine

## 2011-08-25 DIAGNOSIS — J45909 Unspecified asthma, uncomplicated: Secondary | ICD-10-CM

## 2011-08-25 DIAGNOSIS — F909 Attention-deficit hyperactivity disorder, unspecified type: Secondary | ICD-10-CM

## 2011-08-25 DIAGNOSIS — F411 Generalized anxiety disorder: Secondary | ICD-10-CM

## 2011-08-25 DIAGNOSIS — F900 Attention-deficit hyperactivity disorder, predominantly inattentive type: Secondary | ICD-10-CM

## 2011-08-25 MED ORDER — AMPHETAMINE-DEXTROAMPHET ER 20 MG PO CP24: 40.0000 mg | ORAL_CAPSULE | ORAL | Status: DC

## 2011-08-25 NOTE — Assessment & Plan Note (Signed)
Mood has been okay on the citalopram

## 2011-08-25 NOTE — Assessment & Plan Note (Signed)
Has been doing well on the med Will continue

## 2011-08-25 NOTE — Assessment & Plan Note (Signed)
May have mild cold now but no exacerbation Uses albuterol rarely (2/month maybe)

## 2011-08-25 NOTE — Assessment & Plan Note (Signed)
He is going to donate blood and then we can recheck labs

## 2011-08-25 NOTE — Progress Notes (Signed)
Subjective:    Patient ID: Logan Wise, male    DOB: March 22, 1961, 51 y.o.   MRN: [id number]  HPI Has had some cough in the past 3 days of so No fever  Doesn't feel sick No exacerbation of asthma Some drainage---tried OTC meds without great result  Still satisfied with the adderall Uses on work days---occ skips on weekends Finds he does have some issues if he skips weekends regularly  Stomach is calm Trying to use the prevacid only as needed Now only 3-4 times per week Feels stomach is better with limited gluten  Hasn't given blood yet Hasn't gotten it set up  Current Outpatient Prescriptions on File Prior to Visit  Medication Sig Dispense Refill  . albuterol (VENTOLIN HFA) 108 (90 BASE) MCG/ACT inhaler Inhale 2 puffs into the lungs 3 (three) times daily.        Marland Kitchen aspirin 325 MG tablet Take 325 mg by mouth daily.      . citalopram (CELEXA) 20 MG tablet take 1 tablet by mouth once daily for anxiety and depression  30 tablet  11  . lansoprazole (PREVACID) 30 MG capsule Take 30 mg by mouth as needed.       . Multiple Vitamin (MULTIVITAMIN) tablet Take 1 tablet by mouth daily.      Marland Kitchen testosterone cypionate (DEPOTESTOTERONE CYPIONATE) 200 MG/ML injection Inject into the muscle every 14 (fourteen) days.          Allergies  Allergen Reactions  . Augmentin     Hives  . Peanut-Containing Drug Products     Past Medical History  Diagnosis Date  . Allergy   . Anxiety   . Asthma   . GERD (gastroesophageal reflux disease)   . Arthritis   . Depression   . Low testosterone   . Hyperlipidemia     Past Surgical History  Procedure Date  . Rotator cuff repair     left 12/07, right 3/05 ([facility])  . Knee arthroscopy     right  . Nasal stenosis repair   . Septoplasty     Family History  Problem Relation Age of Onset  . Cancer Mother     cervical  . Diabetes Father   . Heart disease Father   . Hypertension Father     History   Social History  . Marital  Status: Divorced    Spouse Name: N/A    Number of Children: 3  . Years of Education: N/A   Occupational History  . outside sales    Social History Main Topics  . Smoking status: Former Games developer  . Smokeless tobacco: Never Used  . Alcohol Use: Yes  . Drug Use: No  . Sexually Active: Not on file   Other Topics Concern  . Not on file   Social History Narrative  . No narrative on file   Review of Systems Appetite is okay Weight stable Sleeps okay    Objective:   Physical Exam  Constitutional: He appears well-developed and well-nourished. No distress.  HENT:  Mouth/Throat: Oropharynx is clear and moist. No oropharyngeal exudate.  Neck: Normal range of motion. Neck supple.  Cardiovascular: Normal rate, regular rhythm and normal heart sounds.  Exam reveals no gallop.   No murmur heard. Pulmonary/Chest: Effort normal and breath sounds normal. No respiratory distress. He has no wheezes. He has no rales.  Musculoskeletal: He exhibits no edema and no tenderness.  Lymphadenopathy:    He has no cervical adenopathy.  Psychiatric: He has a  normal mood and affect. His behavior is normal. Judgment and thought content normal.          Assessment & Plan:

## 2011-08-29 ENCOUNTER — Encounter: Payer: Self-pay | Admitting: Internal Medicine

## 2011-08-29 ENCOUNTER — Ambulatory Visit (INDEPENDENT_AMBULATORY_CARE_PROVIDER_SITE_OTHER): Payer: 59 | Admitting: Internal Medicine

## 2011-08-29 ENCOUNTER — Telehealth: Payer: Self-pay | Admitting: Cardiology

## 2011-08-29 DIAGNOSIS — J45909 Unspecified asthma, uncomplicated: Secondary | ICD-10-CM

## 2011-08-29 DIAGNOSIS — J209 Acute bronchitis, unspecified: Secondary | ICD-10-CM

## 2011-08-29 MED ORDER — AZITHROMYCIN 250 MG PO TABS: ORAL_TABLET | ORAL | Status: AC

## 2011-08-29 MED ORDER — PREDNISONE 20 MG PO TABS: 40.0000 mg | ORAL_TABLET | Freq: Every day | ORAL | Status: AC

## 2011-08-29 MED ORDER — HYDROCODONE-HOMATROPINE 5-1.5 MG/5ML PO SYRP: 5.0000 mL | ORAL_SOLUTION | Freq: Every evening | ORAL | Status: AC | PRN

## 2011-08-29 NOTE — Telephone Encounter (Signed)
Fu call °Patient returning your call °

## 2011-08-29 NOTE — Assessment & Plan Note (Signed)
Not clearly flared but will give prednisone course

## 2011-08-29 NOTE — Progress Notes (Signed)
  Subjective:    Patient ID: Logan Wise, male    DOB: December 29, 1960, 51 y.o.   MRN: [id number]  HPI Cough has gotten worse---unable to sleep more than 3-4 hours Has been using inhaler more--does help some Cough is dry No head congestion Nasal drainage has stopped No ear pain  ?low grade fever Some sweats and chills at night Some SOB with cough  Current Outpatient Prescriptions on File Prior to Visit  Medication Sig Dispense Refill  . albuterol (VENTOLIN HFA) 108 (90 BASE) MCG/ACT inhaler Inhale 2 puffs into the lungs 3 (three) times daily.        Marland Kitchen amphetamine-dextroamphetamine (ADDERALL XR) 20 MG 24 hr capsule Take 2 capsules (40 mg total) by mouth every morning.  60 capsule  0  . aspirin 325 MG tablet Take 325 mg by mouth daily.      . citalopram (CELEXA) 20 MG tablet take 1 tablet by mouth once daily for anxiety and depression  30 tablet  11  . lansoprazole (PREVACID) 30 MG capsule Take 30 mg by mouth as needed.       . Multiple Vitamin (MULTIVITAMIN) tablet Take 1 tablet by mouth daily.      Marland Kitchen testosterone cypionate (DEPOTESTOTERONE CYPIONATE) 200 MG/ML injection Inject into the muscle every 14 (fourteen) days.          Allergies  Allergen Reactions  . Augmentin     Hives  . Peanut-Containing Drug Products     Past Medical History  Diagnosis Date  . Allergy   . Anxiety   . Asthma   . GERD (gastroesophageal reflux disease)   . Arthritis   . Depression   . Low testosterone   . Hyperlipidemia     Past Surgical History  Procedure Date  . Rotator cuff repair     left 12/07, right 3/05 ([facility])  . Knee arthroscopy     right  . Nasal stenosis repair   . Septoplasty     Family History  Problem Relation Age of Onset  . Cancer Mother     cervical  . Diabetes Father   . Heart disease Father   . Hypertension Father     History   Social History  . Marital Status: Divorced    Spouse Name: N/A    Number of Children: 3  . Years of Education: N/A    Occupational History  . outside sales    Social History Main Topics  . Smoking status: Former Games developer  . Smokeless tobacco: Never Used  . Alcohol Use: Yes  . Drug Use: No  . Sexually Active: Not on file   Other Topics Concern  . Not on file   Social History Narrative  . No narrative on file   Review of Systems Appetite is fine No vomiting or diarrhea     Objective:   Physical Exam  Constitutional: He appears well-developed and well-nourished. No distress.  HENT:  Mouth/Throat: Oropharynx is clear and moist. No oropharyngeal exudate.       No sinus tenderness TMs normal Mild nasal congestion --slight green nasal secretions on right  Neck: Normal range of motion. Neck supple.  Pulmonary/Chest: Effort normal and breath sounds normal. No respiratory distress. He has no wheezes. He has no rales.       Tight cough  Lymphadenopathy:    He has no cervical adenopathy.          Assessment & Plan:

## 2011-08-29 NOTE — Telephone Encounter (Signed)
Patient called was told echo revealed normal LV function,no significant valvular disease.

## 2011-08-29 NOTE — Assessment & Plan Note (Signed)
May have had the flu and now secondary bronchitis vs asthma cough Will give prednisone and z-pak Cough med

## 2011-09-05 ENCOUNTER — Other Ambulatory Visit: Payer: Self-pay | Admitting: Internal Medicine

## 2011-09-24 ENCOUNTER — Telehealth: Payer: Self-pay

## 2011-09-24 MED ORDER — CITALOPRAM HYDROBROMIDE 40 MG PO TABS: 40.0000 mg | ORAL_TABLET | Freq: Every day | ORAL | Status: DC

## 2011-09-24 NOTE — Telephone Encounter (Signed)
Pt seeing Dr Moss Mc and she suggested increasing Celexa from 20 mg to 40 mg. Pt said he called and left v/m 2 weeks ago requesting increase and has not heard back so pt walked in today to discuss. I apologized about no call back from 2 weeks ago but could not find note in chart. Pt said work extremely stressful and would like Celexa increased to 40 mg and sent to [facility] on [address]. Pt can be reached at [phone].Please advise.

## 2011-09-24 NOTE — Telephone Encounter (Signed)
Yes, please apologize for any trouble I did get her message yesterday and hadn't gotten a chance to act on it   Please increase citalopram to 40mg  daily 1 year Rx  If he doesn't have an appt with me for a while, set up visit in 1 month to see how he is on higher dose

## 2011-09-24 NOTE — Telephone Encounter (Signed)
Spoke with patient and advised results rx sent to pharmacy by e-script Pt will call back for appt

## 2011-10-02 ENCOUNTER — Encounter: Payer: Self-pay | Admitting: Cardiology

## 2011-10-03 ENCOUNTER — Other Ambulatory Visit: Payer: Self-pay | Admitting: *Deleted

## 2011-10-03 MED ORDER — AMPHETAMINE-DEXTROAMPHET ER 20 MG PO CP24: 40.0000 mg | ORAL_CAPSULE | ORAL | Status: DC

## 2011-10-03 NOTE — Telephone Encounter (Signed)
Call when ready

## 2011-10-03 NOTE — Telephone Encounter (Signed)
Spoke with patient and advised rx ready for pick-up and it will be at the front desk.  

## 2011-11-13 ENCOUNTER — Telehealth: Payer: Self-pay | Admitting: Internal Medicine

## 2011-11-13 MED ORDER — AMPHETAMINE-DEXTROAMPHET ER 20 MG PO CP24: 40.0000 mg | ORAL_CAPSULE | ORAL | Status: DC

## 2011-11-13 NOTE — Telephone Encounter (Signed)
Patient is requesting a new prescription for adderall °

## 2011-11-14 NOTE — Telephone Encounter (Signed)
Spoke with patient and advised rx ready for pick-up and it will be at the front desk.  

## 2011-11-27 ENCOUNTER — Telehealth: Payer: Self-pay

## 2011-11-27 NOTE — Telephone Encounter (Signed)
Logan Wise with [facility] medical request status of medical release of records. Received 11/26/11. Already has health port info.

## 2011-12-03 ENCOUNTER — Other Ambulatory Visit: Payer: Self-pay | Admitting: Internal Medicine

## 2011-12-22 ENCOUNTER — Other Ambulatory Visit: Payer: Self-pay

## 2011-12-22 MED ORDER — AMPHETAMINE-DEXTROAMPHET ER 20 MG PO CP24: 40.0000 mg | ORAL_CAPSULE | ORAL | Status: DC

## 2011-12-22 NOTE — Telephone Encounter (Signed)
Please  give to patient

## 2011-12-22 NOTE — Telephone Encounter (Signed)
Pt request rx Adderall XR. Dr Alphonsus Sias out of office and pt is out of med. Please advise. Call when ready for pick up.

## 2011-12-23 NOTE — Telephone Encounter (Signed)
Patient advised.  Rx left at front desk for pick up. 

## 2012-02-03 ENCOUNTER — Encounter: Payer: 59 | Admitting: Internal Medicine

## 2012-02-04 ENCOUNTER — Other Ambulatory Visit: Payer: Self-pay

## 2012-02-04 NOTE — Telephone Encounter (Signed)
Pt left v/m requesting rx Adderall. Call when ready for pick up. 

## 2012-02-05 MED ORDER — AMPHETAMINE-DEXTROAMPHET ER 20 MG PO CP24: 40.0000 mg | ORAL_CAPSULE | ORAL | Status: DC

## 2012-02-05 NOTE — Telephone Encounter (Signed)
Please let him know he missed his last follow up appt and will need an appt before I can fill this again

## 2012-02-06 NOTE — Telephone Encounter (Signed)
Spoke with patient and advised rx ready for pick-up and it will be at the front desk. Pt's appt was canceled on 02/03/2012 due to a family emergency for Dr.Letvak. Pt will re-schedule sometime in sept

## 2012-03-24 ENCOUNTER — Other Ambulatory Visit: Payer: Self-pay

## 2012-03-24 MED ORDER — AMPHETAMINE-DEXTROAMPHET ER 20 MG PO CP24: 40.0000 mg | ORAL_CAPSULE | ORAL | Status: DC

## 2012-03-24 NOTE — Telephone Encounter (Signed)
Needs to reschedule his PE appt

## 2012-03-24 NOTE — Telephone Encounter (Signed)
Pt request rx Adderall. Call when ready for pick up.

## 2012-03-24 NOTE — Telephone Encounter (Signed)
Left message on machine that rx is ready for pick-up, and it will be at our front desk. Also left message on VM and on rx envelope that he needs to schedule cpx

## 2012-05-14 ENCOUNTER — Other Ambulatory Visit: Payer: Self-pay

## 2012-05-14 NOTE — Telephone Encounter (Signed)
Pt left v/m requesting rx Adderall Xr. Call when ready for pick up.

## 2012-05-17 ENCOUNTER — Other Ambulatory Visit: Payer: Self-pay

## 2012-05-17 MED ORDER — AMPHETAMINE-DEXTROAMPHET ER 20 MG PO CP24: 40.0000 mg | ORAL_CAPSULE | ORAL | Status: DC

## 2012-05-17 NOTE — Telephone Encounter (Signed)
Waiting on ok from [facility], this was sent late Friday afternoon.

## 2012-05-17 NOTE — Telephone Encounter (Signed)
He is well overdue for his follow up Last appt was cancelled  He needs appt before the next refill

## 2012-05-17 NOTE — Telephone Encounter (Signed)
Pt left v/m requesting refill adderall. Already requested 05/14/12.Please advise.

## 2012-05-17 NOTE — Telephone Encounter (Signed)
Left message on machine that rx is ready for pick-up, and it will be at our front desk. Left a detailed message on machine that pt needs to schedule appt before next refill.

## 2012-05-19 ENCOUNTER — Ambulatory Visit: Payer: 59 | Admitting: Internal Medicine

## 2012-06-02 ENCOUNTER — Encounter: Payer: Self-pay | Admitting: Internal Medicine

## 2012-06-02 ENCOUNTER — Ambulatory Visit (INDEPENDENT_AMBULATORY_CARE_PROVIDER_SITE_OTHER): Payer: 59 | Admitting: Internal Medicine

## 2012-06-02 VITALS — BP 138/80 | HR 82 | Temp 97.9°F | Wt 228.0 lb

## 2012-06-02 DIAGNOSIS — F411 Generalized anxiety disorder: Secondary | ICD-10-CM

## 2012-06-02 DIAGNOSIS — F988 Other specified behavioral and emotional disorders with onset usually occurring in childhood and adolescence: Secondary | ICD-10-CM

## 2012-06-02 DIAGNOSIS — F900 Attention-deficit hyperactivity disorder, predominantly inattentive type: Secondary | ICD-10-CM

## 2012-06-02 MED ORDER — AMPHETAMINE-DEXTROAMPHET ER 20 MG PO CP24: 40.0000 mg | ORAL_CAPSULE | ORAL | Status: DC

## 2012-06-02 NOTE — Assessment & Plan Note (Signed)
Continues to use this with good results Discussed trying occ days off

## 2012-06-02 NOTE — Assessment & Plan Note (Signed)
Has been better Will continue citalopram at 40mg  At next appt, will consider weaning

## 2012-06-02 NOTE — Progress Notes (Signed)
  Subjective:    Patient ID: Logan Wise, male    DOB: 1961/01/04, 51 y.o.   MRN: [id number]  HPI Doing well  Anxiety has been better on the higher citalopram dose Missed 4-5 days of med over the holidays---really had problems Discussed the known withdrawal phenomenon Only sees the counselor occ---along with wife Feels family stressors are his major issue Son is out of house and independent Younger 2 sons will be in college  Still uses the adderall Helps him professionally to maintain attention and his responsibilities Current Outpatient Prescriptions on File Prior to Visit  Medication Sig Dispense Refill  . amphetamine-dextroamphetamine (ADDERALL XR) 20 MG 24 hr capsule Take 2 capsules (40 mg total) by mouth every morning.  60 capsule  0  . aspirin 325 MG tablet Take 325 mg by mouth daily.      . citalopram (CELEXA) 40 MG tablet Take 1 tablet (40 mg total) by mouth daily.  30 tablet  11  . lansoprazole (PREVACID) 30 MG capsule TAKE 1 BY MOUTH ONCE DAILY  30 capsule  11  . Multiple Vitamin (MULTIVITAMIN) tablet Take 1 tablet by mouth daily.      Marland Kitchen testosterone cypionate (DEPOTESTOTERONE CYPIONATE) 200 MG/ML injection Inject into the muscle every 14 (fourteen) days.        . VENTOLIN HFA 108 (90 BASE) MCG/ACT inhaler INHALE 2 PUFFS THREE TIMES A DAY AS NEEDED FOR ASTHMA PROBLEMS  18 g  2  . [DISCONTINUED] lansoprazole (PREVACID) 30 MG capsule Take 30 mg by mouth as needed.         Allergies  Allergen Reactions  . Amoxicillin-Pot Clavulanate     Hives  . Peanut-Containing Drug Products     Past Medical History  Diagnosis Date  . Allergy   . Anxiety   . Asthma   . GERD (gastroesophageal reflux disease)   . Arthritis   . Depression   . Low testosterone   . Hyperlipidemia     Past Surgical History  Procedure Date  . Rotator cuff repair     left 12/07, right 3/05 ([facility])  . Knee arthroscopy     right  . Nasal stenosis repair   . Septoplasty     Family  History  Problem Relation Age of Onset  . Cancer Mother     cervical  . Diabetes Father   . Heart disease Father   . Hypertension Father     History   Social History  . Marital Status: Divorced    Spouse Name: N/A    Number of Children: 3  . Years of Education: N/A   Occupational History  . outside [employer]   Social History Main Topics  . Smoking status: Former Games developer  . Smokeless tobacco: Never Used  . Alcohol Use: Yes  . Drug Use: No  . Sexually Active: Not on file   Other Topics Concern  . Not on file   Social History Narrative  . No narrative on file   Review of Systems Sleeps okay in general No stomach trouble Gained 18# since the last visit---has just restarted his exercise program    Objective:   Physical Exam  Constitutional: He appears well-developed and well-nourished. No distress.  Psychiatric: He has a normal mood and affect. His behavior is normal. Thought content normal.          Assessment & Plan:

## 2012-07-08 ENCOUNTER — Telehealth: Payer: Self-pay | Admitting: Internal Medicine

## 2012-07-08 ENCOUNTER — Other Ambulatory Visit: Payer: Self-pay | Admitting: Internal Medicine

## 2012-07-08 DIAGNOSIS — Z13220 Encounter for screening for lipoid disorders: Secondary | ICD-10-CM

## 2012-07-08 DIAGNOSIS — Z131 Encounter for screening for diabetes mellitus: Secondary | ICD-10-CM

## 2012-07-08 DIAGNOSIS — K219 Gastro-esophageal reflux disease without esophagitis: Secondary | ICD-10-CM

## 2012-07-08 NOTE — Telephone Encounter (Signed)
Okay to set up labs CBC with diff, met b, hepatic (530.81) Lipid--screening Hgba1c---screening for diabetes

## 2012-07-08 NOTE — Telephone Encounter (Signed)
Pt would like to have labs to check his A1c, LBL, HDL, and Triclycerides. Do you approve these labs and want to order them? Thank you.

## 2012-07-15 ENCOUNTER — Ambulatory Visit (INDEPENDENT_AMBULATORY_CARE_PROVIDER_SITE_OTHER): Payer: 59 | Admitting: *Deleted

## 2012-07-15 DIAGNOSIS — Z23 Encounter for immunization: Secondary | ICD-10-CM

## 2012-09-13 ENCOUNTER — Other Ambulatory Visit: Payer: Self-pay

## 2012-09-13 MED ORDER — AMPHETAMINE-DEXTROAMPHET ER 20 MG PO CP24: 40.0000 mg | ORAL_CAPSULE | ORAL | Status: DC

## 2012-09-13 NOTE — Telephone Encounter (Signed)
Pt left v/m requesting rx adderall. Pt wants to pick up this afternoon; pt is out of med and has been out of town for 10 days.Please advise.

## 2012-09-13 NOTE — Telephone Encounter (Signed)
Spoke with patient and advised rx ready for pick-up and it will be at the front desk.  

## 2012-10-06 ENCOUNTER — Other Ambulatory Visit: Payer: Self-pay | Admitting: Internal Medicine

## 2012-10-06 NOTE — Telephone Encounter (Signed)
rx sent to pharmacy by e-script  

## 2012-11-08 ENCOUNTER — Telehealth: Payer: Self-pay | Admitting: Internal Medicine

## 2012-11-08 ENCOUNTER — Other Ambulatory Visit: Payer: Self-pay | Admitting: Internal Medicine

## 2012-11-08 MED ORDER — AMPHETAMINE-DEXTROAMPHET ER 20 MG PO CP24: 40.0000 mg | ORAL_CAPSULE | ORAL | Status: DC

## 2012-11-08 NOTE — Telephone Encounter (Signed)
I don't necessarily need to see him but I do need to know why he thinks he needs an endocrinologist

## 2012-11-08 NOTE — Telephone Encounter (Signed)
Spoke with patient and advised rx ready for pick-up and it will be at the front desk.  

## 2012-11-08 NOTE — Telephone Encounter (Signed)
Pt left v/m requesting rx adderall. Call when ready for pick up. 

## 2012-11-08 NOTE — Telephone Encounter (Signed)
Pt wants a referral to an endocrinologist. Can you refer him or does he need to see you first? Thank you.

## 2012-11-10 NOTE — Telephone Encounter (Signed)
Left message on machine asking why he needs a endocrinologist, advised pt to return my call

## 2012-11-10 NOTE — Telephone Encounter (Signed)
He does note need an endocrinologist for low testosterone. I do prescribe testosterone in limited situations (it has potential risks) I did check in 2009 and the free testosterone was normal  If he wants to discuss treatment, set up appt He should bring copies of any recent labs to facilitate the visit (if not available, we should recheck the levels before a visit)

## 2012-11-10 NOTE — Telephone Encounter (Signed)
Spoke with patient and his urologist suggested he see a endocrinologist because of his low testosterone, please advise

## 2012-11-10 NOTE — Telephone Encounter (Signed)
Left detailed message on cell phone voicemail, advised pt to call for appt and bring labs

## 2012-12-03 ENCOUNTER — Ambulatory Visit: Payer: 59 | Admitting: Internal Medicine

## 2012-12-06 ENCOUNTER — Encounter: Payer: Self-pay | Admitting: Radiology

## 2012-12-07 ENCOUNTER — Encounter: Payer: Self-pay | Admitting: Internal Medicine

## 2012-12-07 ENCOUNTER — Ambulatory Visit (INDEPENDENT_AMBULATORY_CARE_PROVIDER_SITE_OTHER): Payer: 59 | Admitting: Internal Medicine

## 2012-12-07 VITALS — BP 122/62 | HR 75 | Temp 97.7°F | Wt 222.0 lb

## 2012-12-07 DIAGNOSIS — F900 Attention-deficit hyperactivity disorder, predominantly inattentive type: Secondary | ICD-10-CM

## 2012-12-07 DIAGNOSIS — E039 Hypothyroidism, unspecified: Secondary | ICD-10-CM

## 2012-12-07 DIAGNOSIS — F988 Other specified behavioral and emotional disorders with onset usually occurring in childhood and adolescence: Secondary | ICD-10-CM

## 2012-12-07 DIAGNOSIS — F411 Generalized anxiety disorder: Secondary | ICD-10-CM

## 2012-12-07 NOTE — Patient Instructions (Signed)
Please set up fasting blood sugar (V77.91) and TSH, free T4 (244.9) in the next few days

## 2012-12-07 NOTE — Assessment & Plan Note (Signed)
This is a new diagnosis to me Will check his labs

## 2012-12-07 NOTE — Progress Notes (Signed)
Subjective:    Patient ID: Logan Wise, male    DOB: 1961/04/19, 52 y.o.   MRN: [id number]  HPI Here for follow up  Was diagnosed with hypothyroidism 2-3 years ago Had been Rx'd with armour thyroid since then No longer getting from the other practitioner (had gone to ?NP with his wife)  Still on the testosterone Rx Prescribed by Dr Adolm Joseph been in the normal range on Rx  Uses the adderall Monday through Friday May only take 1, instead of 2 if the day isn't as busy Did use 1 today and 2 yesterday  Anxiety has been "great" Discussed weaning---he thinks he tried to wean off this and had exacerbation Wants to keep with current dose  Current Outpatient Prescriptions on File Prior to Visit  Medication Sig Dispense Refill  . amphetamine-dextroamphetamine (ADDERALL XR) 20 MG 24 hr capsule Take 2 capsules (40 mg total) by mouth every morning.  60 capsule  0  . aspirin 325 MG tablet Take 325 mg by mouth daily.      . citalopram (CELEXA) 40 MG tablet take 1 tablet by mouth once daily  30 tablet  2  . lansoprazole (PREVACID) 30 MG capsule TAKE 1 BY MOUTH ONCE DAILY  30 capsule  11  . Multiple Vitamin (MULTIVITAMIN) tablet Take 1 tablet by mouth daily.      Marland Kitchen testosterone cypionate (DEPOTESTOTERONE CYPIONATE) 200 MG/ML injection Inject into the muscle every 14 (fourteen) days.        . VENTOLIN HFA 108 (90 BASE) MCG/ACT inhaler INHALE 2 PUFFS THREE TIMES A DAY AS NEEDED FOR ASTHMA PROBLEMS  18 g  2   No current facility-administered medications on file prior to visit.    Allergies  Allergen Reactions  . Amoxicillin-Pot Clavulanate     Hives  . Peanut-Containing Drug Products     Past Medical History  Diagnosis Date  . Allergy   . Anxiety   . Asthma   . GERD (gastroesophageal reflux disease)   . Arthritis   . Depression   . Low testosterone   . Hyperlipidemia   . Hypothyroid 2011    Past Surgical History  Procedure Laterality Date  . Rotator cuff repair      left 12/07, right 3/05 ([facility])  . Knee arthroscopy      right  . Nasal stenosis repair    . Septoplasty      Family History  Problem Relation Age of Onset  . Cancer Mother     cervical  . Diabetes Father   . Heart disease Father   . Hypertension Father     History   Social History  . Marital Status: Divorced    Spouse Name: N/A    Number of Children: 3  . Years of Education: N/A   Occupational History  . outside [employer]   Social History Main Topics  . Smoking status: Former Games developer  . Smokeless tobacco: Never Used  . Alcohol Use: Yes  . Drug Use: No  . Sexually Active: Not on file   Other Topics Concern  . Not on file   Social History Narrative  . No narrative on file   Review of Systems Asthma quiet--not a problem since his divorce (no animals in house, etc) and had sinus surgery Weight is stable    Objective:   Physical Exam  Constitutional: He appears well-developed and well-nourished. No distress.  Skin:  Extensive ecchymoses along mid medial right thigh (popped muscle  in groin swinging golf club)  Psychiatric: He has a normal mood and affect. His behavior is normal.          Assessment & Plan:

## 2012-12-07 NOTE — Assessment & Plan Note (Signed)
Doing well He wants to continue the current dose of citalopram

## 2012-12-07 NOTE — Assessment & Plan Note (Signed)
Still uses the med for work most days

## 2012-12-08 ENCOUNTER — Other Ambulatory Visit (INDEPENDENT_AMBULATORY_CARE_PROVIDER_SITE_OTHER): Payer: 59

## 2012-12-08 DIAGNOSIS — K219 Gastro-esophageal reflux disease without esophagitis: Secondary | ICD-10-CM

## 2012-12-08 DIAGNOSIS — Z131 Encounter for screening for diabetes mellitus: Secondary | ICD-10-CM

## 2012-12-08 DIAGNOSIS — E039 Hypothyroidism, unspecified: Secondary | ICD-10-CM

## 2012-12-08 DIAGNOSIS — E785 Hyperlipidemia, unspecified: Secondary | ICD-10-CM

## 2012-12-08 DIAGNOSIS — Z13220 Encounter for screening for lipoid disorders: Secondary | ICD-10-CM

## 2012-12-08 LAB — BASIC METABOLIC PANEL
BUN: 22 mg/dL (ref 6–23)
CO2: 24 mEq/L (ref 19–32)
Chloride: 105 mEq/L (ref 96–112)
Creatinine, Ser: 1 mg/dL (ref 0.4–1.5)
Potassium: 4.3 mEq/L (ref 3.5–5.1)

## 2012-12-08 LAB — CBC WITH DIFFERENTIAL/PLATELET
Basophils Relative: 0.9 % (ref 0.0–3.0)
Eosinophils Relative: 2.5 % (ref 0.0–5.0)
Hemoglobin: 14.6 g/dL (ref 13.0–17.0)
Lymphocytes Relative: 31.9 % (ref 12.0–46.0)
Monocytes Relative: 8.5 % (ref 3.0–12.0)
Neutro Abs: 2.9 10*3/uL (ref 1.4–7.7)
Neutrophils Relative %: 56.2 % (ref 43.0–77.0)
RBC: 4.49 Mil/uL (ref 4.22–5.81)
WBC: 5.2 10*3/uL (ref 4.5–10.5)

## 2012-12-08 LAB — LIPID PANEL
LDL Cholesterol: 127 mg/dL — ABNORMAL HIGH (ref 0–99)
Total CHOL/HDL Ratio: 4
Triglycerides: 125 mg/dL (ref 0.0–149.0)
VLDL: 25 mg/dL (ref 0.0–40.0)

## 2012-12-08 LAB — HEPATIC FUNCTION PANEL
ALT: 21 U/L (ref 0–53)
Albumin: 4 g/dL (ref 3.5–5.2)
Bilirubin, Direct: 0 mg/dL (ref 0.0–0.3)
Total Protein: 6.6 g/dL (ref 6.0–8.3)

## 2012-12-15 ENCOUNTER — Encounter: Payer: Self-pay | Admitting: Internal Medicine

## 2012-12-17 ENCOUNTER — Telehealth: Payer: Self-pay

## 2012-12-17 NOTE — Telephone Encounter (Signed)
Pt has not gotten lab results from recent labs. Dr Karle Starch comments on mychart are Your labs look fine  Cholesterol levels are very good  Sugar normal  Everything else is normal also  Very reassuring. Pt requested copy of labs mailed to verified home address. Done.

## 2012-12-27 ENCOUNTER — Other Ambulatory Visit: Payer: Self-pay

## 2012-12-27 NOTE — Telephone Encounter (Signed)
Pt request rx adderall. Call when ready for pick up. 

## 2012-12-28 MED ORDER — AMPHETAMINE-DEXTROAMPHET ER 20 MG PO CP24: 40.0000 mg | ORAL_CAPSULE | ORAL | Status: DC

## 2012-12-28 NOTE — Telephone Encounter (Signed)
Left message on machine that rx is ready for pick-up, and it will be at our front desk.  

## 2013-01-04 ENCOUNTER — Telehealth: Payer: Self-pay

## 2013-01-04 DIAGNOSIS — E291 Testicular hypofunction: Secondary | ICD-10-CM | POA: Insufficient documentation

## 2013-01-04 NOTE — Telephone Encounter (Signed)
Pt left v/m requesting referral to board certified endocrinologist for menopausal symptoms; pt said discussed at 12/07/12 visit.Please advise.

## 2013-01-07 NOTE — Telephone Encounter (Signed)
Appt made with Dr Elvera Lennox and patient notified.

## 2013-01-14 ENCOUNTER — Other Ambulatory Visit: Payer: Self-pay | Admitting: Internal Medicine

## 2013-01-25 ENCOUNTER — Other Ambulatory Visit: Payer: Self-pay | Admitting: Internal Medicine

## 2013-01-26 ENCOUNTER — Ambulatory Visit (INDEPENDENT_AMBULATORY_CARE_PROVIDER_SITE_OTHER): Payer: 59 | Admitting: *Deleted

## 2013-01-26 DIAGNOSIS — Z111 Encounter for screening for respiratory tuberculosis: Secondary | ICD-10-CM

## 2013-01-28 LAB — TB SKIN TEST: TB Skin Test: NEGATIVE

## 2013-02-23 ENCOUNTER — Encounter: Payer: Self-pay | Admitting: Internal Medicine

## 2013-02-23 ENCOUNTER — Ambulatory Visit (INDEPENDENT_AMBULATORY_CARE_PROVIDER_SITE_OTHER): Payer: 59 | Admitting: Internal Medicine

## 2013-02-23 VITALS — BP 118/68 | HR 78 | Temp 98.0°F | Resp 10 | Ht 72.0 in | Wt 228.0 lb

## 2013-02-23 DIAGNOSIS — E039 Hypothyroidism, unspecified: Secondary | ICD-10-CM

## 2013-02-23 DIAGNOSIS — E291 Testicular hypofunction: Secondary | ICD-10-CM

## 2013-02-23 NOTE — Patient Instructions (Signed)
Please return on Friday at 8 am, fasting, for labs. I will let you know about your results through MyChart. Please return in 3 months but we will discuss about your labs as soon as they come back. For now, continue the testosterone injections at the same dose.

## 2013-02-23 NOTE — Progress Notes (Signed)
Patient ID: Logan Wise, male   DOB: April 14, 1961, 52 y.o.   MRN: [id number]  HPI: Logan Wise is a 52 y.o.-year-old man, referred by his PCP, Dr. Alphonsus Sias, for evaluation and management of low testosterone. This is a referral from Dr. that fact, at the suggestion of patient's urologist, who would like to have endocrine involved in patient's testosterone replacement.   Patient was diagnosed with hypogonadism ~3 years ago. He was started on Androgel and started on injections 2 years ago - even on the injections, levels fluctuated between 100s-500s. He feels the same as before. He was started on testosterone replacement by Dr.Stoioff. He is now on testosterone cypionate 100 mg im every 2 weeks. He to the last injection 5 days ago. He administers them himself.  He has hemochromatosis dx by a general practitioner - cannot remember name (not Dr Alphonsus Sias). Did not see a hematologist. He did not have genetic testing. He has venesection every 2 months. He does not have cirrhosis, diabetes, generalized arthritis (has the right knee pain and gets injections) cardiac disease. No FH of hemochromatosis or pituitary tumors. He did not have a pituitary MRI previously.   He admits for decreased libido, did not improve after testosterone replacement No difficulty obtaining or maintaining an erection No trauma to testes, testicular irradiation or surgery No h/o of mumps orchitis/h/o autoimmune ds. No h/o cryptorchidism He grew and went through puberty like his peers. No shrinking of testes. No very small testes (<5 ml) No incomplete/delayed sexual development     No breast discomfort/gynecomastia    No loss of body hair (axillary/pubic)/decreased need for shaving - every day  No height loss No abnormal sense of smell (only allergies) + hot flushes - started this year - associated with facial erythema; mostly with wine, hot foods, not with hot  showers. Has heat intolerance and incraesed sweating. No  vision problems No worst HA of his life No FH of hypogonadism/infertility  No personal h/o infertility - has 3 children; had a vasectomy 18 years ago + weight gain 15 lbs this year after starting lifting weight. Lifts weights 2x a week for 45 min. Cardio - 2-3x a week. No anabolic steroids use. No chronic diseases other than asthma and hemochromatosis. No chronic pain. Not on opiates, takes steroids 2x a year - for last 2 years.  + more than 2 drinks a day of alcohol during the weekend. No herbal medicines. Takes protein shakes.  + history of anxiety/depression. He is on Celexa. He has been on this 6-7 years ago. No AI ds in his family, no FH of MS. He does not have family history of early cardiac disease. Father had a cardiac arrest in his 27s.   He gets DRE every year. No FH of Prostate CA.  Patient also has a history of ADHD, hypothyroidism diagnosed 3 years ago - was on Armour thyroid 30 mg bid for 2-3 years, stopped >1 mo ago as he mentions that he did not notice an effect of the medication. He is not sure why she was started on Armour thyroid and did not know the difference between this and levothyroxine. She also has HL, GERD, asthma. She is divorced, has 3 children.   Reviewed recent labs: 12/08/2012: Hemoglobin A1c 4.9% LDL 127, triglycerides 125 BMP normal, LFTs normal Hemoglobin 14.6 (13-17) 05/08/2011:  Ferritin 673 (30-400) Hemoglobin 18 (12.7-17.7)  Total testosterone 267.1 (409-8119) at 7:21 AM Free testosterone 5.5 (7.2-24) Estradiol 13.6 (7.6 and 42.6) Progesterone 0.3 (0.2-1.4)  TSH 1.16 Vitamin D 40.1 02/02/2008: Total testosterone 270 (350-890) SHBG 32 (13-71) Free T 53.5 (47-244)  ROS: see history of present illness plus: Constitutional: +fatigue, increased appetite, weight gain, feeling hot, hot flashes, poor sleep Eyes: no blurry vision, no xerophthalmia ENT: no sore throat, no nodules palpated in throat, no dysphagia/odynophagia, no hoarseness; decreased  hearing Cardiovascular: no CP/SOB/palpitations/leg swelling Respiratory: no cough/SOB Gastrointestinal: no N/V/D/+C, + heartburn Musculoskeletal: +muscle/+ joint aches Skin: no rashes Neurological: no tremors/numbness/tingling/dizziness Psychiatric: no depression/+ anxiety  Past Medical History  Diagnosis Date  . Allergy   . Anxiety   . Asthma   . GERD (gastroesophageal reflux disease)   . Arthritis   . Depression   . Low testosterone   . Hyperlipidemia   . Hypothyroid 2011    Past Surgical History  Procedure Laterality Date  . Rotator cuff repair      left 12/07, right 3/05 ([facility])  . Knee arthroscopy      right  . Nasal stenosis repair    . Septoplasty      History   Social History  . Marital Status: Divorced    Spouse Name: N/A    Number of Children: 3   Occupational History  . outside sales -sales [employer]     [employer]   Social History Main Topics  . Smoking status: Former Games developer  . Smokeless tobacco: Never Used  . Alcohol Use: Yes - wine, beer, 1-2 a day , but more during weekend (2-3 a day)  . Drug Use: No   Current Outpatient Prescriptions on File Prior to Visit  Medication Sig Dispense Refill  . amphetamine-dextroamphetamine (ADDERALL XR) 20 MG 24 hr capsule Take 2 capsules (40 mg total) by mouth every morning.  60 capsule  0  . ARMOUR THYROID 30 MG tablet Take 60 mg by mouth daily.       Marland Kitchen aspirin 325 MG tablet Take 325 mg by mouth daily.      Marland Kitchen CIALIS 5 MG tablet Take 5 mg by mouth daily as needed.       . citalopram (CELEXA) 40 MG tablet take 1 tablet by mouth once daily  30 tablet  2  . lansoprazole (PREVACID) 30 MG capsule TAKE 1 BY MOUTH ONCE DAILY  30 capsule  11  . Multiple Vitamin (MULTIVITAMIN) tablet Take 1 tablet by mouth daily.      Marland Kitchen testosterone cypionate (DEPOTESTOTERONE CYPIONATE) 200 MG/ML injection Inject into the muscle every 14 (fourteen) days.        . VENTOLIN HFA 108 (90 BASE) MCG/ACT inhaler INHALE 2 PUFFS THREE TIMES A DAY AS  NEEDED FOR ASTHMA PROBLEMS  18 g  2   No current facility-administered medications on file prior to visit.   Allergies  Allergen Reactions  . Amoxicillin-Pot Clavulanate     Hives  . Peanut-Containing Drug Products    Family History  Problem Relation Age of Onset  . Cancer Mother     cervical  . Diabetes Father   . Heart disease Father   . Hypertension Father    PE: BP 118/68  Pulse 78  Temp(Src) 98 F (36.7 C) (Oral)  Resp 10  Ht 6' (1.829 m)  Wt 228 lb (103.42 kg)  BMI 30.92 kg/m2  SpO2 96% Wt Readings from Last 3 Encounters:  12/07/12 222 lb (100.699 kg)  06/02/12 228 lb (103.42 kg)  08/29/11 210 lb (95.255 kg)   Constitutional:  normal  weight, in NAD, appears muscular, ruddy complexion Eyes: PERRLA,  EOMI, no exophthalmos ENT: moist mucous membranes, no thyromegaly, no cervical lymphadenopathy Cardiovascular: RRR, No MRG Respiratory: CTA B Gastrointestinal: abdomen soft, NT, ND, BS+ Musculoskeletal: no deformities, strength intact in all 4 Skin: moist, warm, no rashes Neurological: no tremor with outstretched hands, DTR normal in all 4 Genital exam: normal male escutcheon, no inguinal LAD, normal phallus, testes ~25 mL, no testicular masses, no penile discharge.  No gynecomastia.  ASSESSMENT: 1. Low testosterone - normal free T - on SSRI (Celexa) - also h/o hemochromatosis - on Cialis  2. Hemochromatosis  3. Hypothyroidism - Noncompliant with Armour thyroid due to lack of perceived effect   - Was on Armour thyroid 60 mg daily (equivalent of 100 mcg of free T4) - stopped more than a month ago  PLAN:  1. Hypogonadism Pt with few years h/o hypogonadism, on testosterone injections (100 mg im every 2 weeks) followed by urology, with latest testosterone level in the 500. Pt mentions that he did not feel a big difference after starting testosterone replacement, and his levels fluctuate between 100s and 500s. He would like to get an endocrine evaluation to see  if there is another process influencing the levels and if the dose can be pushed up.  - I discussed with him about possible reasons of hypogonadism: primary and secondary. I do not have FSH/LH values from before starting treatment or afterwards, but secondary (hypogonadotropic) hypogonadism is more common. He has several possible reasons for his low T: Age-related decrease in T SSRI use Steroid injections Hemochromatosis (this is less likely as he has mild ds, without diabetes, cirrhosis, heart ds) ?OSA (he snores - unclear if has OSA) - We discussed about each of the above in detail and I suggested that he tries to come off the SSRI (he would like to do this - advised to d/w Dr Alphonsus Sias first and not to stop completely, but taper off). I strongly advised him to try to avoid steroid injections as much as he can and we discussed possible side effects. Regarding HC, continue venesection. I feel that this is less likely the culprit for his low T due to reasons above. - we had a discussion about why I suggest to be replaced with T only to midnormal range (latest cardiac effect studies). He has a FH of cardiac arrest in his father in his 16s, but he personally does not have heart ds) - for now, we decided to check a series of tests, and proceed with further investigation depending on the results Orders Placed This Encounter  Procedures  . Prolactin  . Insulin-like growth factor  . Cortisol  . ACTH  . TSH  . T4, free  . T3, free  . PSA  . CBC  . Testosterone, free, total  . Luteinizing hormone  . Follicle Stimulating Hormone  . IBC panel  . Thyroid peroxidase antibody  - for the above labs, he will return in 2 days, half way between his testosterone injections  2. Hypothyroidism - I would check a TSH, free T4, free T3, but also add thyroid peroxidase antibody the investigative hypothyroidism. It is possible that hemochromatosis can cause hypothyroidism, but this is not common, and unlikely in  the presence of mild  disease  - We did discuss about pros and cons of Armour and patient agreed to start levothyroxine/ Synthroid if he needs replacement  3. Hemochromatosis - discussed possible consequences  - advised compliance with venesection - advised to drink < 2 alcoholic drinks a day -  check ferritin today Component     Latest Ref Rng 02/25/2013  WBC     4.5 - 10.5 K/uL 4.3 (L)  RBC     4.22 - 5.81 Mil/uL 4.78  Platelets     150.0 - 400.0 K/uL 171.0  Hemoglobin     13.0 - 17.0 g/dL 16.1  HCT     09.6 - 04.5 % 43.7  MCV     78.0 - 100.0 fl 91.5  MCHC     30.0 - 36.0 g/dL 40.9  RDW     81.1 - 91.4 % 12.6  Testosterone     300 - 890 ng/dL 782  Sex Hormone Binding     13 - 71 nmol/L 25  Testosterone Free     47.0 - 244.0 pg/mL 212.7  Testosterone-% Freee.     1.6 - 2.9 % 2.6  Iron     42 - 165 ug/dL 956  Transferrin     213.0 - 360.0 mg/dL 865.7  Saturation Ratios     20.0 - 50.0 % 48.1  Prolactin     2.1 - 17.1 ng/mL 5.7  Cortisol, Plasma      6.5  C206 ACTH     10 - 46 pg/mL 18  TSH     0.35 - 5.50 uIU/mL 1.39  Free T4     0.60 - 1.60 ng/dL 8.46  T3, Free     2.3 - 4.2 pg/mL 2.7  PSA     0.10 - 4.00 ng/mL 0.90  LH     1.50 - 9.30 mIU/mL 0.03 (L)  FSH     1.4 - 18.1 mIU/ML 0.2 (L)  Thyroid Peroxidase Antibody     <35.0 IU/mL <10.0   Based on above result, I would not increase Testosterone dose - would actually aim for mid-normal interval. Since this was the case in the past, I would continue the current dose. No other pituitary dysfunction (has low FSH and LH, likely from testosterone inj + likely secondary hypogonadism). No erythrocytosis. No increased PSA. Normal TFTs >> no need for replacement, but would continue to check TFTs once a year - if normal at next check >> we need to take hypothyroidism off the problem list Hemochromatosis controlled.

## 2013-02-25 ENCOUNTER — Other Ambulatory Visit (INDEPENDENT_AMBULATORY_CARE_PROVIDER_SITE_OTHER): Payer: 59

## 2013-02-25 DIAGNOSIS — E039 Hypothyroidism, unspecified: Secondary | ICD-10-CM

## 2013-02-25 DIAGNOSIS — E291 Testicular hypofunction: Secondary | ICD-10-CM

## 2013-02-25 LAB — CBC
Hemoglobin: 15.4 g/dL (ref 13.0–17.0)
MCHC: 35.1 g/dL (ref 30.0–36.0)
Platelets: 171 10*3/uL (ref 150.0–400.0)
RBC: 4.78 Mil/uL (ref 4.22–5.81)

## 2013-02-25 LAB — TSH: TSH: 1.39 u[IU]/mL (ref 0.35–5.50)

## 2013-02-25 LAB — PSA: PSA: 0.9 ng/mL (ref 0.10–4.00)

## 2013-02-25 LAB — IBC PANEL: Transferrin: 225.8 mg/dL (ref 212.0–360.0)

## 2013-02-26 LAB — PROLACTIN: Prolactin: 5.7 ng/mL (ref 2.1–17.1)

## 2013-03-01 LAB — TESTOSTERONE, FREE, TOTAL, SHBG
Testosterone, Free: 212.7 pg/mL (ref 47.0–244.0)
Testosterone-% Free: 2.6 % (ref 1.6–2.9)
Testosterone: 805 ng/dL (ref 300–890)

## 2013-03-01 LAB — THYROID PEROXIDASE ANTIBODY: Thyroperoxidase Ab SerPl-aCnc: <10 IU/mL (ref <35.0)

## 2013-04-04 ENCOUNTER — Encounter: Payer: Self-pay | Admitting: Internal Medicine

## 2013-04-19 ENCOUNTER — Other Ambulatory Visit: Payer: Self-pay

## 2013-04-19 NOTE — Telephone Encounter (Signed)
Pt left v/m requesting rx adderall xr. Call when ready for refill.  Pt also request to cut his adderall dose in half. Pt does not want #60. Pt wants to take Adderall XR 20 mg taking one daily # 30.Please advise.

## 2013-04-20 MED ORDER — AMPHETAMINE-DEXTROAMPHET ER 20 MG PO CP24: 20.0000 mg | ORAL_CAPSULE | ORAL | Status: DC

## 2013-04-20 NOTE — Telephone Encounter (Signed)
Left message on machine that rx is ready for pick-up, and it will be at our front desk.  

## 2013-04-26 ENCOUNTER — Ambulatory Visit (INDEPENDENT_AMBULATORY_CARE_PROVIDER_SITE_OTHER): Payer: 59 | Admitting: Internal Medicine

## 2013-04-26 ENCOUNTER — Encounter: Payer: Self-pay | Admitting: Internal Medicine

## 2013-04-26 VITALS — BP 140/90 | HR 81 | Temp 98.6°F | Wt 232.0 lb

## 2013-04-26 DIAGNOSIS — F411 Generalized anxiety disorder: Secondary | ICD-10-CM

## 2013-04-26 DIAGNOSIS — T148 Other injury of unspecified body region: Secondary | ICD-10-CM

## 2013-04-26 DIAGNOSIS — F988 Other specified behavioral and emotional disorders with onset usually occurring in childhood and adolescence: Secondary | ICD-10-CM

## 2013-04-26 DIAGNOSIS — E291 Testicular hypofunction: Secondary | ICD-10-CM

## 2013-04-26 DIAGNOSIS — W57XXXA Bitten or stung by nonvenomous insect and other nonvenomous arthropods, initial encounter: Secondary | ICD-10-CM | POA: Insufficient documentation

## 2013-04-26 DIAGNOSIS — F900 Attention-deficit hyperactivity disorder, predominantly inattentive type: Secondary | ICD-10-CM

## 2013-04-26 MED ORDER — ALUMINUM CHLORIDE 20 % EX SOLN: Freq: Every day | CUTANEOUS | Status: DC

## 2013-04-26 NOTE — Patient Instructions (Signed)
Please go to every other day with the adderall for 2 weeks. If doing well, you can then stop it. If you are doing okay off the adderall for 2-3 weeks, you can then start weaning down the citalopram. Take only 20mg  twice a week (and 40mg  the other days) for 2 weeks---then go down to 40mg  alternating with 20mg  every other day. After 1 month, if you are doing well, you can decrease it to 20mg  daily.

## 2013-04-26 NOTE — Assessment & Plan Note (Signed)
Discussed the med Will try to wean off altogether

## 2013-04-26 NOTE — Progress Notes (Signed)
Subjective:    Patient ID: Logan Wise, male    DOB: [birth year], 52 y.o.   MRN: [id number]  HPI Bit by tick 7-8 weeks ago Was embedded concerned about possible infection with Lyme disease (wife had a bad time with this) Wants testing for this  Did see Dr Elvera Lennox Still no answer about his excessive sweating He has cut back on the testosterone (down to every other week--without any change)  Has cut the adderall in half--only 20mg  a day May try to stop it all the time Knows he has always had ADHD but has learned to cope Tries to hold on weekends  No depression Still gets anxious at times---seems to be just reaction to "life"  Current Outpatient Prescriptions on File Prior to Visit  Medication Sig Dispense Refill  . amphetamine-dextroamphetamine (ADDERALL XR) 20 MG 24 hr capsule Take 1 capsule (20 mg total) by mouth every morning.  30 capsule  0  . CIALIS 5 MG tablet Take 5 mg by mouth daily as needed.       . citalopram (CELEXA) 40 MG tablet take 1 tablet by mouth once daily  30 tablet  2  . lansoprazole (PREVACID) 30 MG capsule TAKE 1 BY MOUTH ONCE DAILY  30 capsule  11  . Multiple Vitamin (MULTIVITAMIN) tablet Take 1 tablet by mouth daily.      Marland Kitchen testosterone cypionate (DEPOTESTOTERONE CYPIONATE) 200 MG/ML injection Inject into the muscle every 14 (fourteen) days.        . VENTOLIN HFA 108 (90 BASE) MCG/ACT inhaler INHALE 2 PUFFS THREE TIMES A DAY AS NEEDED FOR ASTHMA PROBLEMS  18 g  2   No current facility-administered medications on file prior to visit.    Allergies  Allergen Reactions  . Amoxicillin-Pot Clavulanate     Hives  . Peanut-Containing Drug Products     Past Medical History  Diagnosis Date  . Allergy   . Anxiety   . Asthma   . GERD (gastroesophageal reflux disease)   . Arthritis   . Depression   . Low testosterone   . Hyperlipidemia   . Hypothyroid 2011    Past Surgical History  Procedure Laterality Date  . Rotator cuff repair      left  12/07, right 3/05 ([facility])  . Knee arthroscopy      right  . Nasal stenosis repair    . Septoplasty      Family History  Problem Relation Age of Onset  . Cancer Mother     cervical  . Diabetes Father   . Heart disease Father   . Hypertension Father     History   Social History  . Marital Status: Divorced    Spouse Name: N/A    Number of Children: 3  . Years of Education: N/A   Occupational History  . outside [employer]   Social History Main Topics  . Smoking status: Former Games developer  . Smokeless tobacco: Never Used  . Alcohol Use: Yes  . Drug Use: No  . Sexual Activity: Not on file   Other Topics Concern  . Not on file   Social History Narrative  . No narrative on file   Review of Systems Weight is up 4# Doing heavy weight training    Objective:   Physical Exam  Constitutional: He appears well-developed and well-nourished. No distress.  Musculoskeletal:  No joint swelling  Skin: No rash noted.  Psychiatric: He has a normal mood and affect.  His behavior is normal.          Assessment & Plan:

## 2013-04-26 NOTE — Assessment & Plan Note (Signed)
Fatigue and joint aches No ECM No neurologic signs Wife had Lyme and he is focused on this  Will just check western blot

## 2013-04-26 NOTE — Assessment & Plan Note (Signed)
Has cut back the testosterone

## 2013-04-26 NOTE — Assessment & Plan Note (Signed)
Seems better  Will try to wean off if he does well off the adderall

## 2013-04-27 LAB — B. BURGDORFI ANTIBODIES BY WB: B burgdorferi IgM Abs (IB): NEGATIVE

## 2013-10-28 ENCOUNTER — Encounter: Payer: 59 | Admitting: Internal Medicine

## 2013-12-27 ENCOUNTER — Telehealth: Payer: Self-pay

## 2013-12-27 NOTE — Telephone Encounter (Signed)
Pt request appt for TB skin test for work; pt scheduled 01/06/14 at 3pm.

## 2014-01-06 ENCOUNTER — Ambulatory Visit: Payer: 59

## 2014-01-23 ENCOUNTER — Telehealth: Payer: Self-pay

## 2014-01-23 NOTE — Telephone Encounter (Signed)
Pt left v/m requesting cb to schedule TB skin test for work. Pt was no show on 01/06/14. Left v/m for pt to cb.

## 2014-01-23 NOTE — Telephone Encounter (Signed)
Rescheduled 7/29

## 2014-01-25 ENCOUNTER — Ambulatory Visit (INDEPENDENT_AMBULATORY_CARE_PROVIDER_SITE_OTHER): Payer: 59 | Admitting: *Deleted

## 2014-01-25 DIAGNOSIS — Z111 Encounter for screening for respiratory tuberculosis: Secondary | ICD-10-CM

## 2014-01-27 LAB — TB SKIN TEST
Induration: 0 mm
TB Skin Test: NEGATIVE

## 2014-04-03 ENCOUNTER — Encounter: Payer: Self-pay | Admitting: Internal Medicine

## 2014-04-03 ENCOUNTER — Ambulatory Visit (INDEPENDENT_AMBULATORY_CARE_PROVIDER_SITE_OTHER): Payer: 59 | Admitting: Internal Medicine

## 2014-04-03 VITALS — BP 142/92 | HR 82 | Temp 97.9°F | Resp 14 | Wt 248.0 lb

## 2014-04-03 DIAGNOSIS — F411 Generalized anxiety disorder: Secondary | ICD-10-CM

## 2014-04-03 DIAGNOSIS — K429 Umbilical hernia without obstruction or gangrene: Secondary | ICD-10-CM

## 2014-04-03 MED ORDER — CITALOPRAM HYDROBROMIDE 20 MG PO TABS: 20.0000 mg | ORAL_TABLET | Freq: Every day | ORAL | Status: DC

## 2014-04-03 NOTE — Progress Notes (Signed)
Subjective:    Patient ID: Logan Wise, male    DOB: 1961/05/05, 53 y.o.   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 B-D 3CC LUER-LOK SYR 21GX1-1/2 21G X 1-1/2" 3 ML MISC       . CIALIS 5 MG tablet Take 5 mg by mouth daily as needed.       . lansoprazole (PREVACID) 30 MG capsule TAKE 1 BY MOUTH ONCE DAILY  30 capsule  11  . testosterone cypionate (DEPOTESTOTERONE CYPIONATE) 200 MG/ML injection Inject into the muscle every 14 (fourteen) days.        . VENTOLIN HFA 108 (90 BASE) MCG/ACT inhaler INHALE 2 PUFFS THREE TIMES A DAY AS NEEDED FOR ASTHMA PROBLEMS  18 g  2  . amphetamine-dextroamphetamine (ADDERALL XR) 20 MG 24 hr capsule Take 1 capsule (20 mg total) by mouth every morning.  30 capsule  0  . citalopram (CELEXA) 40 MG tablet take 1 tablet by mouth once daily  30 tablet  2  . Multiple Vitamin (MULTIVITAMIN) tablet Take 1 tablet by mouth daily.       No current facility-administered medications on file prior to visit.    Allergies  Allergen Reactions  . Amoxicillin-Pot Clavulanate     Hives  . Peanut-Containing Drug Products     Past Medical History  Diagnosis Date  . Allergy   . Anxiety   . Asthma   . GERD (gastroesophageal reflux disease)   . Arthritis   . Depression   . Low testosterone   . Hyperlipidemia   . Hypothyroid 2011    Past Surgical History    Procedure Laterality Date  . Rotator cuff repair      left 12/07, right 3/05 ([facility])  . Knee arthroscopy      right  . Nasal stenosis repair    . Septoplasty      Family History  Problem Relation Age of Onset  . Cancer Mother     cervical  . Diabetes Father   . Heart disease Father   . Hypertension Father     History   Social History  . Marital Status: Divorced    Spouse Name: N/A    Number of Children: 3  . Years of Education: N/A   Occupational History  . outside [place] History Main Topics  . Smoking status: Former Research scientist (life sciences)  . Smokeless tobacco: Never Used  . Alcohol Use: Yes  . Drug Use: No  . Sexual Activity: Not on file   Other Topics Concern  . Not on file   Social History Narrative  . No narrative on file   Review of Systems Sleep is not good--- inconsistent. Initiates well but up 3AM and can't get back to sleep. Still working out but weight is up    Objective:   Physical Exam  Constitutional: He  appears well-developed and well-nourished. No distress.  Abdominal: Soft. He exhibits no distension. There is no rebound and no guarding.  Small infraumbilical hernia. Tender at first--then seemed to reduce  Psychiatric: He has a normal mood and affect. His behavior is normal.  Normal appearance and speech          Assessment & Plan:

## 2014-04-03 NOTE — Assessment & Plan Note (Signed)
Lots of work stress Tends to be anxious in general and this has really worsened his anxiety. Will restart the citalopram

## 2014-04-03 NOTE — Progress Notes (Signed)
Pre visit review using our clinic review tool, if applicable. No additional management support is needed unless otherwise documented below in the visit note. 

## 2014-04-03 NOTE — Assessment & Plan Note (Addendum)
Very small and reduced here  Discussed being careful with his exercise---like avoid crunches  May need to consider surgery if pain persists (would use [facility])

## 2014-04-04 ENCOUNTER — Telehealth: Payer: Self-pay | Admitting: Internal Medicine

## 2014-04-04 ENCOUNTER — Encounter: Payer: Self-pay | Admitting: *Deleted

## 2014-04-04 DIAGNOSIS — K429 Umbilical hernia without obstruction or gangrene: Secondary | ICD-10-CM

## 2014-04-04 NOTE — Telephone Encounter (Signed)
Pt called and would like referral for is hernia. Please advise

## 2014-04-04 NOTE — Telephone Encounter (Signed)
Appt made with Dr Bary Castilla on 04/18/14 at [place]. [facility]

## 2014-04-17 ENCOUNTER — Encounter: Payer: Self-pay | Admitting: Cardiology

## 2014-04-17 ENCOUNTER — Ambulatory Visit (INDEPENDENT_AMBULATORY_CARE_PROVIDER_SITE_OTHER): Payer: 59 | Admitting: Cardiology

## 2014-04-17 VITALS — BP 140/86 | HR 61 | Ht 72.0 in | Wt 245.6 lb

## 2014-04-17 DIAGNOSIS — R011 Cardiac murmur, unspecified: Secondary | ICD-10-CM

## 2014-04-17 DIAGNOSIS — Z79899 Other long term (current) drug therapy: Secondary | ICD-10-CM

## 2014-04-17 DIAGNOSIS — K429 Umbilical hernia without obstruction or gangrene: Secondary | ICD-10-CM

## 2014-04-17 DIAGNOSIS — I10 Essential (primary) hypertension: Secondary | ICD-10-CM

## 2014-04-17 MED ORDER — AMLODIPINE BESYLATE 5 MG PO TABS: 5.0000 mg | ORAL_TABLET | Freq: Every day | ORAL | Status: DC

## 2014-04-17 NOTE — Progress Notes (Signed)
04/17/2014   PCP: Viviana Simpler, MD   Chief Complaint  Patient presents with  . Follow-up    pt c/o high blood pressure for the last 6-8 months     Primary Cardiologist:Dr. P. Martinique   HPI:  He is a 53 year old white male who in general has been in good health. On recent evaluation he was noted to have an elevated hemoglobin of 18. Further evaluation revealed a high serum ferritin of 673. He does have a history of a heart murmur all his life and has never been formally evaluated. He has not started phlebotomy therapy.  Last seen by Dr. P. Martinique in 2013, echo at that time for murmur: Left ventricle: The cavity size was normal. Wall thickness was increased in a pattern of mild LVH. Systolic function was normal. The estimated ejection fraction was in the range of 55% to 60%. Wall motion was normal; there were no regional wall motion abnormalities.  Here today for HTN. Over the last year his BP has been elevated.  Today it is up as well.  Other visits with other providers, 142/92,  140/90 this is new from previous years.  He did have mild LVH on last echo.  He denies chest pain or SOB.  No other cardiac issues.  He is under stress at work with his boss.  He does exercise 3 X week and eats healthy, decreased salt.    Allergies  Allergen Reactions  . Amoxicillin-Pot Clavulanate     Hives  . Peanut-Containing Drug Products     Current Outpatient Prescriptions  Medication Sig Dispense Refill  . anastrozole (ARIMIDEX) 1 MG tablet Take 1 tablet by mouth once a week.      . B-D 3CC LUER-LOK SYR 21GX1-1/2 21G X 1-1/2" 3 ML MISC       . celecoxib (CELEBREX) 200 MG capsule Take 1 capsule by mouth daily.      Marland Kitchen CIALIS 5 MG tablet Take 5 mg by mouth daily as needed.       . citalopram (CELEXA) 20 MG tablet Take 1 tablet (20 mg total) by mouth daily.  30 tablet  3  . Hylan (SYNVISC) 16 MG/2ML SOSY Inject 2 mLs as directed.      Marland Kitchen liothyronine (CYTOMEL) 5 MCG tablet Take 1  tablet by mouth daily.      Marland Kitchen testosterone cypionate (DEPOTESTOTERONE CYPIONATE) 200 MG/ML injection Inject into the muscle every 14 (fourteen) days.        . VENTOLIN HFA 108 (90 BASE) MCG/ACT inhaler INHALE 2 PUFFS THREE TIMES A DAY AS NEEDED FOR ASTHMA PROBLEMS  18 g  2  . amLODipine (NORVASC) 5 MG tablet Take 1 tablet (5 mg total) by mouth daily.  30 tablet  9   No current facility-administered medications for this visit.    Past Medical History  Diagnosis Date  . Allergy   . Anxiety   . Asthma   . GERD (gastroesophageal reflux disease)   . Arthritis   . Depression   . Low testosterone   . Hyperlipidemia   . Hypothyroid 2011    Past Surgical History  Procedure Laterality Date  . Rotator cuff repair      left 12/07, right 3/05 ([facility])  . Knee arthroscopy      right  . Nasal stenosis repair    . Septoplasty      XKG:YJEHUDJ:SH colds or fevers,  weight has increased Skin:no rashes or ulcers  HEENT:no blurred vision, no congestion CV:see HPI PUL:see HPI GI:no diarrhea constipation or melena, no indigestion GU:no hematuria, no dysuria MS:no joint pain, no claudication Neuro:no syncope, no lightheadedness Endo:no diabetes, no thyroid disease  Wt Readings from Last 3 Encounters:  04/17/14 245 lb 9.6 oz (111.403 kg)  04/03/14 248 lb (112.492 kg)  04/26/13 232 lb (105.235 kg)    PHYSICAL EXAM BP 140/86  Pulse 61  Ht 6' (1.829 m)  Wt 245 lb 9.6 oz (111.403 kg)  BMI 33.30 kg/m2 General:Pleasant affect, NAD Skin:Warm and dry, brisk capillary refill HEENT:normocephalic, sclera clear, mucus membranes moist Neck:supple, no JVD, no bruits  Heart:S1S2 RRR without murmur, gallup, rub or click Lungs:clear without rales, rhonchi, or wheezes OVA:NVBT, non tender, + BS, do not palpate liver spleen or masses Ext:no lower ext edema, 2+ pedal pulses, 2+ radial pulses Neuro:alert and oriented, MAE, follows commands, + facial symmetry EKG:SR non specific T wave abnormality HR  61, no acute changes.  ASSESSMENT AND PLAN HTN (hypertension) Add norvasc 5 mg daily follow up with Dr. P. Martinique in 2 months. Check BMP  Murmur Soft systolic, echo in 6606 stable.   Umbilical hernia To see surgeon in [place] next week for possible repair.

## 2014-04-17 NOTE — Assessment & Plan Note (Addendum)
Add norvasc 5 mg daily follow up with Dr. P. Martinique in 2 months. Check BMP

## 2014-04-17 NOTE — Patient Instructions (Addendum)
Your physician recommends that you schedule a follow-up appointment in: 2 Months with Dr Neita Garnet  Your physician has recommended you make the following change in your medication: Start Amlodipine 5 mg daily  Your physician has recommended you make the following change in your medication: BMP Today

## 2014-04-17 NOTE — Assessment & Plan Note (Signed)
Soft systolic, echo in 4373 stable.

## 2014-04-17 NOTE — Assessment & Plan Note (Signed)
To see surgeon in [place] next week for possible repair.

## 2014-04-18 ENCOUNTER — Ambulatory Visit: Payer: Self-pay | Admitting: General Surgery

## 2014-04-20 ENCOUNTER — Encounter: Payer: Self-pay | Admitting: General Surgery

## 2014-04-20 ENCOUNTER — Ambulatory Visit (INDEPENDENT_AMBULATORY_CARE_PROVIDER_SITE_OTHER): Payer: 59 | Admitting: General Surgery

## 2014-04-20 VITALS — BP 124/86 | HR 80 | Resp 14 | Ht 72.0 in | Wt 237.0 lb

## 2014-04-20 DIAGNOSIS — E669 Obesity, unspecified: Secondary | ICD-10-CM

## 2014-04-20 DIAGNOSIS — K429 Umbilical hernia without obstruction or gangrene: Secondary | ICD-10-CM

## 2014-04-20 NOTE — Progress Notes (Signed)
Patient ID: Logan Wise, male   DOB: 1961/02/07, 53 y.o.   MRN: [id number]  Chief Complaint  Patient presents with  . Other    New Pt evaluation of umbilical hernia    HPI Logan Wise is a 53 y.o. male who presents for an evaluation of an umbilical hernia. The patient states the hernia has been present for approximately 6 months ago. He state he has gained some weight earlier in the year but is working on losing weight currently. He states he started having some discomfort earlier in the year but didn't connect it to anything. The area has gotten larger since he first noticed it 6 months ago. He describes the pain as a dull ache but if he works out it become more acute.   HPI  Past Medical History  Diagnosis Date  . Allergy   . Anxiety   . Asthma   . GERD (gastroesophageal reflux disease)   . Arthritis   . Depression   . Low testosterone   . Hyperlipidemia   . Hypothyroid 2011    Past Surgical History  Procedure Laterality Date  . Rotator cuff repair      left 12/07, right 3/05 ([facility])  . Knee arthroscopy      right  . Nasal stenosis repair    . Septoplasty      Family History  Problem Relation Age of Onset  . Cancer Mother     cervical  . Diabetes Father   . Heart disease Father   . Hypertension Father     Social History History  Substance Use Topics  . Smoking status: Former Research scientist (life sciences)  . Smokeless tobacco: Never Used  . Alcohol Use: Yes    Allergies  Allergen Reactions  . Amoxicillin-Pot Clavulanate     Hives  . Peanut-Containing Drug Products     Current Outpatient Prescriptions  Medication Sig Dispense Refill  . amLODipine (NORVASC) 5 MG tablet Take 1 tablet (5 mg total) by mouth daily.  30 tablet  9  . anastrozole (ARIMIDEX) 1 MG tablet Take 1 tablet by mouth once a week.      . celecoxib (CELEBREX) 200 MG capsule Take 1 capsule by mouth daily.      Marland Kitchen CIALIS 5 MG tablet Take 5 mg by mouth daily as needed.       . citalopram  (CELEXA) 20 MG tablet Take 1 tablet (20 mg total) by mouth daily.  30 tablet  3  . Hylan (SYNVISC) 16 MG/2ML SOSY Inject 2 mLs as directed.      Marland Kitchen liothyronine (CYTOMEL) 5 MCG tablet Take 1 tablet by mouth daily.      Marland Kitchen testosterone cypionate (DEPOTESTOTERONE CYPIONATE) 200 MG/ML injection Inject into the muscle every 14 (fourteen) days.        . VENTOLIN HFA 108 (90 BASE) MCG/ACT inhaler INHALE 2 PUFFS THREE TIMES A DAY AS NEEDED FOR ASTHMA PROBLEMS  18 g  2   No current facility-administered medications for this visit.    Review of Systems Review of Systems  Constitutional: Negative.   Respiratory: Negative.   Cardiovascular: Negative.   Gastrointestinal: Positive for abdominal pain.    Blood pressure 124/86, pulse 80, resp. rate 14, height 6' (1.829 m), weight 237 lb (107.502 kg).  Physical Exam Physical Exam  Constitutional: He is oriented to person, place, and time. He appears well-developed and well-nourished.  Cardiovascular: Normal rate, regular rhythm and normal heart sounds.   No murmur heard. Pulmonary/Chest: Effort  normal and breath sounds normal.  Abdominal: Soft. Normal appearance and bowel sounds are normal. There is no hepatosplenomegaly. There is tenderness. A hernia (1cm fascial defect at the umbilicus.) is present.  Neurological: He is alert and oriented to person, place, and time.  Skin: Skin is warm and dry.    Data Reviewed No records to review.  Assessment    Symptomatic umbilical hernia.    Plan    The role surgical repair was discussed. The possible use of prosthetic mesh placement based on operative findings was reviewed. Risks associated with surgery including bleeding, infection and pain were discussed.  Patient's surgery has been scheduled for 05-10-14 at [facility].    PCP/Ref Md: Barbette Hair 04/22/2014, 12:11 PM

## 2014-04-20 NOTE — Patient Instructions (Addendum)
Patient to be scheduled for hernia repair. The patient is aware to call back for any questions or concerns.  Open Hernia Repair Open hernia repair is surgery to fix a hernia. A hernia occurs when an internal organ or tissue pushes out through a weak spot in the abdominal wall muscles. Hernias commonly occur in the groin and around the navel. Most hernias tend to get worse over time. Surgery is often done to prevent the hernia from getting bigger, becoming uncomfortable, or becoming an emergency. Emergency surgery may be needed if abdominal contents get stuck in the opening (incarcerated hernia) or the blood supply gets cut off (strangulated hernia). In an open repair, a large cut (incision) is made in the abdomen to perform the surgery. LET [facility] CARE PROVIDER KNOW ABOUT:  Any allergies you have.  All medicines you are taking, including vitamins, herbs, eye drops, creams, and over-the-counter medicines.  Previous problems you or members of your family have had with the use of anesthetics.  Any blood disorders you have.  Previous surgeries you have had.  Medical conditions you have. RISKS AND COMPLICATIONS Generally, this is a safe procedure. However, as with any procedure, complications can occur. Possible complications include:  Infection.  Bleeding.  Nerve injury.  Chronic pain.  The hernia can come back.  Injury to the intestines. BEFORE THE PROCEDURE  Ask your health care provider about changing or stopping any regular medicines. Avoid taking aspirin or blood thinners as directed by your health care provider.  Do noteat or drink anything after midnight the night before surgery.  If you smoke, do not smoke for at least 2 weeks before your surgery.  Do not drink alcohol the day before your surgery.  Let your health care provider know if you develop a cold or any infection before your surgery.  Arrange for someone to drive you home after the procedure or after your  hospital stay. Also arrange for someone to help you with activities during recovery. PROCEDURE   Small monitors will be put on your body. They are used to check your heart, blood pressure, and oxygen level.   An IV access tube will be put into one of your veins. Medicine will be able to flow directly into your body through this IV tube.   You might be given a medicine to help you relax (sedative).   You will be given a medicine to make you sleep (general anesthetic). A breathing tube may be placed into your lungs during the procedure.  A cut (incision) is made over the hernia defect, and the contents are pushed back into the abdomen.  If the hernia is small, stitches may be used to bring the muscle edges back together.  Typically, a surgeon will place a mesh patch made of man-made material (synthetic) to cover the defect. The mesh is sewn to healthy muscle. This reduces the risk of the hernia coming back.  The tissue and skin over the hernia are then closed with stitches or staples.  If the hernia was large, a drain may be left in place to collect excess fluid where the hernia used to be.  Bandages (dressings) are used to cover the incision. AFTER THE PROCEDURE  You will be taken to a recovery area where your progress will be monitored.  If the hernia was small or in the groin (inguinal) region, you will likely be allowed to go home once you are awake, stable, and taking fluids well.  If the hernia was  large, you may have to wait for your bowel function to return. You may need to stay in the hospital for 2-3 days until you can eat and your pain is controlled. A drain may be left in place for 5-7 days. You will be taught how to care for the drain. Document Released: 12/10/2000 Document Revised: 04/06/2013 Document Reviewed: 01/26/2013 [facility] Patient Information 2015 [place], [place]. This information is not intended to replace advice given to you by your health care provider. Make  sure you discuss any questions you have with your health care provider.   Patient's surgery has been scheduled for 05-10-14 at [facility].

## 2014-04-22 ENCOUNTER — Other Ambulatory Visit: Payer: Self-pay | Admitting: General Surgery

## 2014-04-22 DIAGNOSIS — K429 Umbilical hernia without obstruction or gangrene: Secondary | ICD-10-CM

## 2014-04-22 DIAGNOSIS — E669 Obesity, unspecified: Secondary | ICD-10-CM | POA: Insufficient documentation

## 2014-04-28 ENCOUNTER — Telehealth: Payer: Self-pay

## 2014-04-28 NOTE — Telephone Encounter (Signed)
Patient called to say that he needed to cancel his hernia surgery scheduled for 05/10/14. He will call back once he is able to reschedule this. I have notified surgery about the cancellation.

## 2014-05-03 ENCOUNTER — Telehealth: Payer: Self-pay | Admitting: *Deleted

## 2014-05-03 NOTE — Telephone Encounter (Signed)
Patient called wanting to reschedule umbilical hernia repair for 05-29-14 at [facility].  Leah in the [place]. notified accordingly.

## 2014-05-19 ENCOUNTER — Other Ambulatory Visit: Payer: Self-pay

## 2014-05-19 MED ORDER — ALBUTEROL SULFATE HFA 108 (90 BASE) MCG/ACT IN AERS: INHALATION_SPRAY | RESPIRATORY_TRACT | Status: DC

## 2014-05-19 NOTE — Telephone Encounter (Signed)
Pt left v/m requesting refill ventolin inhaler to [facility] [place]. Is it OK to refill?

## 2014-05-22 ENCOUNTER — Telehealth: Payer: Self-pay

## 2014-05-22 NOTE — Telephone Encounter (Signed)
Message left for patient to call back to review medications and any changes that would require a pre op visit.

## 2014-05-22 NOTE — Telephone Encounter (Signed)
Patient states that he recently started a new blood pressure medication, Amlodipine 5mg  PO daily. He also had some questions about an anti nausea patch for surgery. Patient to come in on 05/23/14 at 3:45 pm for a short pre op visit here.

## 2014-05-22 NOTE — Telephone Encounter (Signed)
-----   Message from Robert Bellow, MD sent at 05/22/2014 10:59 AM EST ----- Patient rescheduled surgery for November 30th. Please contact him and confirm no change in meds/ health since October visit.  If a change is present, we will need to work him in tomorrow for a preop visit. Thanks

## 2014-05-23 ENCOUNTER — Ambulatory Visit (INDEPENDENT_AMBULATORY_CARE_PROVIDER_SITE_OTHER): Payer: 59 | Admitting: General Surgery

## 2014-05-23 VITALS — BP 140/80 | HR 74 | Resp 12 | Ht 72.0 in | Wt 242.0 lb

## 2014-05-23 DIAGNOSIS — K429 Umbilical hernia without obstruction or gangrene: Secondary | ICD-10-CM

## 2014-05-23 DIAGNOSIS — R110 Nausea: Secondary | ICD-10-CM

## 2014-05-23 MED ORDER — SCOPOLAMINE 1 MG/3DAYS TD PT72: 1.0000 | MEDICATED_PATCH | Freq: Once | TRANSDERMAL | Status: DC

## 2014-05-23 NOTE — Progress Notes (Addendum)
Patient ID: Logan Wise, male   DOB: 1960/11/02, 53 y.o.   MRN: [id number]  Chief Complaint  Patient presents with  . Pre-op Exam    umbilical hernia     HPI Logan Wise is a 53 y.o. male here today for his pre op umbilical hernia surgery scheduled for 05/29/14.  HPI  Past Medical History  Diagnosis Date  . Allergy   . Anxiety   . Asthma   . GERD (gastroesophageal reflux disease)   . Arthritis   . Depression   . Low testosterone   . Hyperlipidemia   . Hypothyroid 2011    Past Surgical History  Procedure Laterality Date  . Rotator cuff repair      left 12/07, right 3/05 ([facility])  . Knee arthroscopy      right  . Nasal stenosis repair    . Septoplasty      Family History  Problem Relation Age of Onset  . Cancer Mother     cervical  . Diabetes Father   . Heart disease Father   . Hypertension Father     Social History History  Substance Use Topics  . Smoking status: Former Research scientist (life sciences)  . Smokeless tobacco: Never Used  . Alcohol Use: Yes    Allergies  Allergen Reactions  . Amoxicillin-Pot Clavulanate     Hives  . Peanut-Containing Drug Products     Current Outpatient Prescriptions  Medication Sig Dispense Refill  . albuterol (VENTOLIN HFA) 108 (90 BASE) MCG/ACT inhaler INHALE 2 PUFFS THREE TIMES A DAY AS NEEDED FOR ASTHMA PROBLEMS 18 g 2  . amLODipine (NORVASC) 5 MG tablet Take 1 tablet (5 mg total) by mouth daily. 30 tablet 9  . anastrozole (ARIMIDEX) 1 MG tablet Take 1 tablet by mouth once a week.    . celecoxib (CELEBREX) 200 MG capsule Take 1 capsule by mouth daily.    Marland Kitchen CIALIS 5 MG tablet Take 5 mg by mouth daily as needed.     . citalopram (CELEXA) 20 MG tablet Take 1 tablet (20 mg total) by mouth daily. 30 tablet 3  . Hylan (SYNVISC) 16 MG/2ML SOSY Inject 2 mLs as directed.    Marland Kitchen liothyronine (CYTOMEL) 5 MCG tablet Take 1 tablet by mouth daily.    Marland Kitchen testosterone cypionate (DEPOTESTOTERONE CYPIONATE) 200 MG/ML injection Inject into the  muscle every 14 (fourteen) days.      Derrill Memo ON 05/28/2014] scopolamine (TRANSDERM-SCOP) 1 MG/3DAYS Place 1 patch (1.5 mg total) onto the skin once. 4 patch 0   No current facility-administered medications for this visit.    Review of Systems Review of Systems  Constitutional: Negative.   Respiratory: Negative.   Cardiovascular: Negative.     Blood pressure 140/80, pulse 74, resp. rate 12, height 6' (1.829 m), weight 242 lb (109.77 kg).  Physical Exam Physical Exam  Constitutional: He is oriented to person, place, and time. He appears well-developed and well-nourished.  Cardiovascular: Normal rate and regular rhythm.   Murmur heard.  Systolic murmur is present with a grade of 1/6  Pulmonary/Chest: Effort normal and breath sounds normal.  Abdominal: Soft. Normal appearance and bowel sounds are normal. Splenomegaly:  umbilical hernia. There is no tenderness. A hernia is present.  1.5 cm fascial defect at the umbilicus.  Neurological: He is alert and oriented to person, place, and time.  Skin: Skin is warm.      Assessment    Symptomatic umbilical hernia.    Plan    Patient  is scheduled for umbilical hernia surgery on 05/29/14.      PCP/Ref Md: Barbette Hair 05/24/2014, 2:13 PM

## 2014-05-23 NOTE — Patient Instructions (Signed)
Patient is scheduled for umbilical hernia surgery on 05/29/14.

## 2014-05-29 ENCOUNTER — Ambulatory Visit: Payer: Self-pay | Admitting: General Surgery

## 2014-05-29 DIAGNOSIS — K429 Umbilical hernia without obstruction or gangrene: Secondary | ICD-10-CM

## 2014-05-29 HISTORY — PX: HERNIA REPAIR: SHX51

## 2014-05-30 ENCOUNTER — Encounter: Payer: Self-pay | Admitting: General Surgery

## 2014-06-05 ENCOUNTER — Encounter: Payer: Self-pay | Admitting: General Surgery

## 2014-06-05 ENCOUNTER — Ambulatory Visit (INDEPENDENT_AMBULATORY_CARE_PROVIDER_SITE_OTHER): Payer: Self-pay | Admitting: General Surgery

## 2014-06-05 VITALS — BP 148/82 | HR 76 | Resp 12 | Ht 72.0 in | Wt 245.0 lb

## 2014-06-05 DIAGNOSIS — K429 Umbilical hernia without obstruction or gangrene: Secondary | ICD-10-CM

## 2014-06-05 NOTE — Progress Notes (Signed)
Patient ID: Logan Wise, male   DOB: Mar 28, 1961, 53 y.o.   MRN: [id number]  Chief Complaint  Patient presents with  . Routine Post Op    umbilical hernia    HPI Logan Wise is a 53 y.o. male here today for his post op umbilical hernia repair done on 05/29/14. Patient states he is doing well. He only required 2 Norco tablets post procedure. HPI  Past Medical History  Diagnosis Date  . Allergy   . Anxiety   . Asthma   . GERD (gastroesophageal reflux disease)   . Arthritis   . Depression   . Low testosterone   . Hyperlipidemia   . Hypothyroid 2011    Past Surgical History  Procedure Laterality Date  . Rotator cuff repair      left 12/07, right 3/05 ([facility])  . Knee arthroscopy      right  . Nasal stenosis repair    . Septoplasty    . Hernia repair  05/29/14    Family History  Problem Relation Age of Onset  . Cancer Mother     cervical  . Diabetes Father   . Heart disease Father   . Hypertension Father     Social History History  Substance Use Topics  . Smoking status: Former Research scientist (life sciences)  . Smokeless tobacco: Never Used  . Alcohol Use: Yes    Allergies  Allergen Reactions  . Amoxicillin-Pot Clavulanate     Hives  . Peanut-Containing Drug Products     Current Outpatient Prescriptions  Medication Sig Dispense Refill  . albuterol (VENTOLIN HFA) 108 (90 BASE) MCG/ACT inhaler INHALE 2 PUFFS THREE TIMES A DAY AS NEEDED FOR ASTHMA PROBLEMS 18 g 2  . amLODipine (NORVASC) 5 MG tablet Take 1 tablet (5 mg total) by mouth daily. 30 tablet 9  . anastrozole (ARIMIDEX) 1 MG tablet Take 1 tablet by mouth once a week.    . celecoxib (CELEBREX) 200 MG capsule Take 1 capsule by mouth daily.    Marland Kitchen CIALIS 5 MG tablet Take 5 mg by mouth daily as needed.     . citalopram (CELEXA) 20 MG tablet Take 1 tablet (20 mg total) by mouth daily. 30 tablet 3  . Hylan (SYNVISC) 16 MG/2ML SOSY Inject 2 mLs as directed.    Marland Kitchen liothyronine (CYTOMEL) 5 MCG tablet Take 1 tablet by  mouth daily.    Marland Kitchen scopolamine (TRANSDERM-SCOP) 1 MG/3DAYS Place 1 patch (1.5 mg total) onto the skin once. 4 patch 0  . testosterone cypionate (DEPOTESTOTERONE CYPIONATE) 200 MG/ML injection Inject into the muscle every 14 (fourteen) days.       No current facility-administered medications for this visit.    Review of Systems Review of Systems  Constitutional: Negative.   Respiratory: Negative.   Cardiovascular: Negative.     Blood pressure 148/82, pulse 76, resp. rate 12, height 6' (1.829 m), weight 245 lb (111.131 kg).  Physical Exam Physical Exam  Constitutional: He is oriented to person, place, and time. He appears well-developed and well-nourished.  Pulmonary/Chest:  Umbilical hernia is healing well.    Neurological: He is alert and oriented to person, place, and time.  Skin: Skin is warm and dry.      Assessment    Doing well status post umbilical hernia repair.    Plan    Return to regular activities was reviewed. Care was strenuous lifting was discussed. Patient to return as needed.  PCP/Ref Md: Barbette Hair 06/06/2014, 12:17 PM

## 2014-06-05 NOTE — Patient Instructions (Signed)
Proper lifting techniques reviewed.

## 2014-06-07 ENCOUNTER — Ambulatory Visit: Payer: 59 | Admitting: General Surgery

## 2014-06-14 ENCOUNTER — Encounter: Payer: 59 | Admitting: Internal Medicine

## 2014-06-21 ENCOUNTER — Other Ambulatory Visit: Payer: Self-pay

## 2014-06-21 NOTE — Telephone Encounter (Signed)
Pt left v/m requesting refill celexa for anxiety to [facility] [address]. Pt indicated [facility] faxed refill form for celexa last week with no response and pt request refill done today. Please advise.

## 2014-06-22 MED ORDER — CITALOPRAM HYDROBROMIDE 20 MG PO TABS: 20.0000 mg | ORAL_TABLET | Freq: Every day | ORAL | Status: DC

## 2014-07-06 ENCOUNTER — Ambulatory Visit: Payer: 59 | Admitting: Cardiology

## 2014-07-10 ENCOUNTER — Encounter: Payer: Self-pay | Admitting: Cardiology

## 2014-07-10 ENCOUNTER — Ambulatory Visit (INDEPENDENT_AMBULATORY_CARE_PROVIDER_SITE_OTHER): Payer: 59 | Admitting: Cardiology

## 2014-07-10 VITALS — BP 140/90 | HR 80 | Ht 72.0 in | Wt 244.7 lb

## 2014-07-10 DIAGNOSIS — I10 Essential (primary) hypertension: Secondary | ICD-10-CM

## 2014-07-10 DIAGNOSIS — E785 Hyperlipidemia, unspecified: Secondary | ICD-10-CM

## 2014-07-10 NOTE — Progress Notes (Signed)
Logan Wise Date of Birth: Jun 25, 1961 Medical Record #[id number]  History of Present Illness: Logan Wise is seen for follow up of HTN.  He has a history of iron overload that has been treated with periodic donation of blood. He had an Echo in 2013 that was normal. He was seen in October for HTN and started on amlodipine. He has tolerated this well and reports his BP has consistently been in the lower 160F systolic and 80 diastolic. He is exercising although limited by arthritic knees. He has made very positive dietary modifications to try and lose weight. He is planning to have surgery for bone spurs in his ankle.   Current Outpatient Prescriptions on File Prior to Visit  Medication Sig Dispense Refill  . albuterol (VENTOLIN HFA) 108 (90 BASE) MCG/ACT inhaler INHALE 2 PUFFS THREE TIMES A DAY AS NEEDED FOR ASTHMA PROBLEMS 18 g 2  . amLODipine (NORVASC) 5 MG tablet Take 1 tablet (5 mg total) by mouth daily. 30 tablet 9  . anastrozole (ARIMIDEX) 1 MG tablet Take 1 tablet by mouth once a week.    . celecoxib (CELEBREX) 200 MG capsule Take 1 capsule by mouth daily.    Marland Kitchen CIALIS 5 MG tablet Take 5 mg by mouth daily as needed.     . citalopram (CELEXA) 20 MG tablet Take 1 tablet (20 mg total) by mouth daily. 30 tablet 3  . Hylan (SYNVISC) 16 MG/2ML SOSY Inject 2 mLs as directed.    Marland Kitchen liothyronine (CYTOMEL) 5 MCG tablet Take 1 tablet by mouth daily.    Marland Kitchen scopolamine (TRANSDERM-SCOP) 1 MG/3DAYS Place 1 patch (1.5 mg total) onto the skin once. 4 patch 0  . testosterone cypionate (DEPOTESTOTERONE CYPIONATE) 200 MG/ML injection Inject into the muscle every 14 (fourteen) days.       No current facility-administered medications on file prior to visit.    Allergies  Allergen Reactions  . Amoxicillin-Pot Clavulanate     Hives  . Peanut-Containing Drug Products     Past Medical History  Diagnosis Date  . Allergy   . Anxiety   . Asthma   . GERD (gastroesophageal reflux disease)   .  Arthritis   . Depression   . Low testosterone   . Hyperlipidemia   . Hypothyroid 2011    Past Surgical History  Procedure Laterality Date  . Rotator cuff repair      left 12/07, right 3/05 ([facility])  . Knee arthroscopy      right  . Nasal stenosis repair    . Septoplasty    . Hernia repair  05/29/14    History  Smoking status  . Former Smoker  Smokeless tobacco  . Never Used    History  Alcohol Use  . Yes    Family History  Problem Relation Age of Onset  . Cancer Mother     cervical  . Diabetes Father   . Heart disease Father   . Hypertension Father     Review of Systems: As noted in history of present illness..  All other systems were reviewed and are negative.  Physical Exam: BP 140/90 mmHg  Pulse 80  Ht 6' (1.829 m)  Wt 244 lb 11.2 oz (110.995 kg)  BMI 33.18 kg/m2 He is a WDWM in NAD.  The HEENT is normal.  The carotids are 2+ without bruits.  There is no thyromegaly.  There is no JVD.  The lungs are clear.    The heart exam reveals a regular  rate with a normal S1 and S2. No gallop or murmur.  The PMI is not displaced.   Abdominal exam reveals good bowel sounds.   There is no hepatosplenomegaly or tenderness.  There are no masses.  Exam of the legs reveal no clubbing, cyanosis, or edema.    The distal pulses are intact.  Cranial nerves II - XII are intact.  Motor and sensory functions are intact.  The gait is normal.  LABORATORY DATA:   Assessment / Plan: 1. HTN. BP is borderline today but readings have been very acceptable on amlodipine. Continue current therapy and lifestyle modification.  2. Iron overload- corrected with phlebotomy.  Patient will follow up with primary care. I will see prn.

## 2014-07-10 NOTE — Patient Instructions (Signed)
Continue your current therapy  Work on your dietary modifications and weight loss  I will see you as needed.

## 2014-08-01 ENCOUNTER — Encounter: Payer: Self-pay | Admitting: Internal Medicine

## 2014-08-01 ENCOUNTER — Ambulatory Visit (INDEPENDENT_AMBULATORY_CARE_PROVIDER_SITE_OTHER): Payer: 59 | Admitting: Internal Medicine

## 2014-08-01 VITALS — BP 128/86 | HR 64 | Temp 98.3°F | Ht 72.0 in | Wt 239.8 lb

## 2014-08-01 DIAGNOSIS — Z0000 Encounter for general adult medical examination without abnormal findings: Secondary | ICD-10-CM

## 2014-08-01 DIAGNOSIS — Z23 Encounter for immunization: Secondary | ICD-10-CM

## 2014-08-01 DIAGNOSIS — F411 Generalized anxiety disorder: Secondary | ICD-10-CM

## 2014-08-01 DIAGNOSIS — Z1211 Encounter for screening for malignant neoplasm of colon: Secondary | ICD-10-CM

## 2014-08-01 DIAGNOSIS — I10 Essential (primary) hypertension: Secondary | ICD-10-CM

## 2014-08-01 MED ORDER — ALBUTEROL SULFATE HFA 108 (90 BASE) MCG/ACT IN AERS: INHALATION_SPRAY | RESPIRATORY_TRACT | Status: DC

## 2014-08-01 NOTE — Progress Notes (Signed)
Pre visit review using our clinic review tool, if applicable. No additional management support is needed unless otherwise documented below in the visit note. 

## 2014-08-01 NOTE — Addendum Note (Signed)
Addended by: Emelia Salisbury C on: 08/01/2014 09:15 AM   Modules accepted: Orders

## 2014-08-01 NOTE — Assessment & Plan Note (Signed)
BP Readings from Last 3 Encounters:  08/01/14 128/86  07/10/14 140/90  06/05/14 148/82   Good control No changes needed

## 2014-08-01 NOTE — Assessment & Plan Note (Signed)
Gets regular phlebotomy

## 2014-08-01 NOTE — Assessment & Plan Note (Signed)
Has done well back on citalopram Will continue

## 2014-08-01 NOTE — Patient Instructions (Signed)
Please get me a copy of your lab work.

## 2014-08-01 NOTE — Progress Notes (Signed)
Subjective:    Patient ID: Logan Wise, male    DOB: Feb 13, 1961, 54 y.o.   MRN: [id number]  HPI Here for physical  Back on the citalopram This has helped "immensely" His boss seems to be improving as well  Sees Dr Adriana Simas Rx for the testosterone and then takes arimidex--to prevent testosterone conversion to estradiol Discussed that he needs to consider his bone density--should discuss with her Recent PSA was fine Also gets T3 from her--gets lots of blood work from her  Blase Mess at [facility] for his knees Has gotten synvisc On celebrex but will be stopping Bone spurs in feet---will need ankle surgery soon  Still gives blood every 2 months Has had CBC with Dr Franki Cabot he thinks  Asthma is fairly quiet He keeps inhaler at home and in car---but rarely uses  Current Outpatient Prescriptions on File Prior to Visit  Medication Sig Dispense Refill  . amLODipine (NORVASC) 5 MG tablet Take 1 tablet (5 mg total) by mouth daily. 30 tablet 9  . anastrozole (ARIMIDEX) 1 MG tablet Take 1 tablet by mouth once a week.    . celecoxib (CELEBREX) 200 MG capsule Take 1 capsule by mouth daily.    Marland Kitchen CIALIS 5 MG tablet Take 5 mg by mouth daily as needed.     . citalopram (CELEXA) 20 MG tablet Take 1 tablet (20 mg total) by mouth daily. 30 tablet 3  . liothyronine (CYTOMEL) 5 MCG tablet Take 1 tablet by mouth daily.    Marland Kitchen testosterone cypionate (DEPOTESTOTERONE CYPIONATE) 200 MG/ML injection Inject into the muscle every 14 (fourteen) days.       No current facility-administered medications on file prior to visit.    Allergies  Allergen Reactions  . Amoxicillin-Pot Clavulanate     Hives  . Peanut-Containing Drug Products     Past Medical History  Diagnosis Date  . Allergy   . Anxiety   . Asthma   . GERD (gastroesophageal reflux disease)   . Arthritis   . Depression   . Low testosterone   . Hyperlipidemia   . Hypothyroid 2011    Past Surgical History    Procedure Laterality Date  . Rotator cuff repair      left 12/07, right 3/05 ([facility])  . Knee arthroscopy      right  . Nasal stenosis repair    . Septoplasty    . Hernia repair  05/29/14    Family History  Problem Relation Age of Onset  . Cancer Mother     cervical  . Diabetes Father   . Heart disease Father   . Hypertension Father     History   Social History  . Marital Status: Married    Spouse Name: N/A    Number of Children: 3  . Years of Education: N/A   Occupational History  . outside [place] History Main Topics  . Smoking status: Former Research scientist (life sciences)  . Smokeless tobacco: Never Used  . Alcohol Use: 0.0 oz/week    0 Not specified per week  . Drug Use: No  . Sexual Activity: Not on file   Other Topics Concern  . Not on file   Social History Narrative   Review of Systems  Constitutional: Negative for fatigue and unexpected weight change.       Wears seat belt  HENT: Negative for dental problem, hearing loss and tinnitus.        Regular  with dentist  Eyes: Negative for visual disturbance.       No diplopia or unilateral vision loss  Respiratory: Negative for cough, chest tightness and shortness of breath.   Cardiovascular: Negative for chest pain, palpitations and leg swelling.  Gastrointestinal: Negative for nausea, vomiting, abdominal pain, constipation and blood in stool.       No heartburn  Endocrine: Negative for polydipsia and polyuria.  Genitourinary: Negative for urgency, frequency and difficulty urinating.       No sexual problems  Musculoskeletal: Positive for back pain and arthralgias. Negative for joint swelling.  Skin: Negative for rash.       No suspicious lesions Sees derm yearly  Allergic/Immunologic: Positive for environmental allergies. Negative for immunocompromised state.  Neurological: Negative for dizziness, syncope, weakness, light-headedness, numbness and headaches.  Hematological: Negative for adenopathy.  Does not bruise/bleed easily.  Psychiatric/Behavioral: Negative for sleep disturbance and dysphoric mood. The patient is nervous/anxious.        Objective:   Physical Exam  Constitutional: He is oriented to person, place, and time. He appears well-developed and well-nourished. No distress.  HENT:  Head: Normocephalic and atraumatic.  Right Ear: External ear normal.  Left Ear: External ear normal.  Mouth/Throat: Oropharynx is clear and moist. No oropharyngeal exudate.  Eyes: Conjunctivae and EOM are normal. Pupils are equal, round, and reactive to light.  Neck: Normal range of motion. Neck supple. No thyromegaly present.  Cardiovascular: Normal rate, regular rhythm, normal heart sounds and intact distal pulses.  Exam reveals no gallop.   No murmur heard. Pulmonary/Chest: Effort normal and breath sounds normal. No respiratory distress. He has no wheezes. He has no rales.  Abdominal: Soft. There is no tenderness.  Musculoskeletal: He exhibits no edema or tenderness.  Lymphadenopathy:    He has no cervical adenopathy.  Neurological: He is alert and oriented to person, place, and time.  Skin: No rash noted. No erythema.  Psychiatric: He has a normal mood and affect. His behavior is normal.          Assessment & Plan:

## 2014-08-01 NOTE — Assessment & Plan Note (Signed)
Flu shot today Will set up for colonoscopy Had PSA Getting alternative treatment I don't really agree with--he will get me a copy of all the labs he has had

## 2014-10-21 NOTE — Op Note (Signed)
PATIENT NAME:  Logan Wise, Logan Wise MR#:  [id number] DATE OF BIRTH:  Aug 19, 1960  DATE OF PROCEDURE:  05/29/2014  PREOPERATIVE DIAGNOSIS: Umbilical hernia.   POSTOPERATIVE DIAGNOSIS: Umbilical hernia.  OPERATIVE PROCEDURE: Repair of umbilical hernia.   SURGEON: Hervey Ard, MD  ANESTHESIA: General by LMA, Marcaine 0.5% plain, 30 mL local infiltration.   ESTIMATED BLOOD LOSS: Minimal.   CLINICAL NOTE: This 54 year old male has developed a symptomatic umbilical hernia. The fascial defect is thought to be less than 2 cm in diameter. He is admitted for elective repair.   OPERATIVE NOTE: Hair was removed from the field prior to being brought to the operating theater. The area was prepped with ChloraPrep and draped. An infraumbilical incision was made and carried down through the skin and subcutaneous tissue with hemostasis achieved by electrocautery. The umbilical skin was elevated off the underlying hernia sac. The latter was excised and discarded. The fascia edge was cleared. Hemostasis was with electrocautery. The defect was approximately 1.5 cm in diameter. This was approximated with interrupted 0 Surgilon simple sutures. These were all placed under direct vision and then tied sequentially. The umbilical skin was tacked to the fascia with 3-0 Vicryl figure-of-eight suture. The adipose layer was approximated with a running 3-0 Vicryl. The skin was closed with a running 4-0 Vicryl subcuticular suture. Benzoin, Steri-Strips, Telfa and Tegaderm dressing was applied.   The patient tolerated the procedure well and was taken to the recovery room in stable condition.    ____________________________ Robert Bellow, MD jwb:TT D: 05/29/2014 20:05:02 ET T: 05/29/2014 21:05:39 ET JOB#: [id number]  cc: Robert Bellow, MD, <Dictator> Venia Carbon, MD Sayeed Weatherall Amedeo Kinsman MD ELECTRONICALLY SIGNED 05/30/2014 21:08

## 2014-10-27 ENCOUNTER — Other Ambulatory Visit: Payer: Self-pay

## 2014-11-08 LAB — HM COLONOSCOPY

## 2014-11-20 ENCOUNTER — Encounter: Payer: Self-pay | Admitting: Internal Medicine

## 2015-01-29 ENCOUNTER — Other Ambulatory Visit: Payer: Self-pay | Admitting: Orthopaedic Surgery

## 2015-01-29 DIAGNOSIS — M25572 Pain in left ankle and joints of left foot: Secondary | ICD-10-CM

## 2015-01-29 DIAGNOSIS — M19172 Post-traumatic osteoarthritis, left ankle and foot: Secondary | ICD-10-CM

## 2015-02-21 ENCOUNTER — Ambulatory Visit
Admission: RE | Admit: 2015-02-21 | Discharge: 2015-02-21 | Disposition: A | Discharge status: Home / Self Care | Payer: 59 | Source: Ambulatory Visit | Attending: Orthopaedic Surgery | Admitting: Orthopaedic Surgery

## 2015-02-21 DIAGNOSIS — M25572 Pain in left ankle and joints of left foot: Secondary | ICD-10-CM | POA: Insufficient documentation

## 2015-02-21 DIAGNOSIS — M19172 Post-traumatic osteoarthritis, left ankle and foot: Secondary | ICD-10-CM | POA: Insufficient documentation

## 2015-02-21 DIAGNOSIS — S86912A Strain of unspecified muscle(s) and tendon(s) at lower leg level, left leg, initial encounter: Secondary | ICD-10-CM | POA: Diagnosis not present

## 2015-02-21 DIAGNOSIS — X58XXXA Exposure to other specified factors, initial encounter: Secondary | ICD-10-CM | POA: Insufficient documentation

## 2015-03-07 ENCOUNTER — Other Ambulatory Visit: Payer: Self-pay | Admitting: Internal Medicine

## 2015-03-07 NOTE — Telephone Encounter (Signed)
Last filled 05/2014 with 3 refills---pt had CPE with you 07/2014---please advise if okay for pt to continue

## 2015-03-08 NOTE — Telephone Encounter (Signed)
Approved: okay to fill for a year 

## 2015-03-08 NOTE — Telephone Encounter (Signed)
Pt request status of [facility]italopram to [facility]; advised pt refill done and pt will ck with pharmacy and call back if needed.

## 2015-03-23 ENCOUNTER — Other Ambulatory Visit: Payer: Self-pay | Admitting: Cardiology

## 2015-03-23 NOTE — Telephone Encounter (Signed)
REFILL 

## 2015-04-06 ENCOUNTER — Ambulatory Visit (INDEPENDENT_AMBULATORY_CARE_PROVIDER_SITE_OTHER): Payer: 59 | Admitting: *Deleted

## 2015-04-06 DIAGNOSIS — Z111 Encounter for screening for respiratory tuberculosis: Secondary | ICD-10-CM | POA: Diagnosis not present

## 2015-04-09 LAB — TB SKIN TEST
INDURATION: 0 mm
TB SKIN TEST: NEGATIVE

## 2015-05-01 HISTORY — PX: ANKLE RECONSTRUCTION: SHX1151

## 2015-07-13 ENCOUNTER — Other Ambulatory Visit: Payer: Self-pay | Admitting: Cardiology

## 2015-08-06 ENCOUNTER — Encounter: Payer: 59 | Admitting: Internal Medicine

## 2015-09-05 ENCOUNTER — Other Ambulatory Visit (HOSPITAL_COMMUNITY): Payer: Self-pay | Admitting: Orthopedic Surgery

## 2015-09-05 DIAGNOSIS — M79605 Pain in left leg: Secondary | ICD-10-CM

## 2015-09-05 DIAGNOSIS — R52 Pain, unspecified: Secondary | ICD-10-CM

## 2015-09-06 ENCOUNTER — Ambulatory Visit (HOSPITAL_COMMUNITY)
Admission: RE | Admit: 2015-09-06 | Discharge: 2015-09-06 | Disposition: A | Discharge status: Home / Self Care | Payer: 59 | Source: Ambulatory Visit | Attending: Vascular Surgery | Admitting: Vascular Surgery

## 2015-09-06 DIAGNOSIS — R52 Pain, unspecified: Secondary | ICD-10-CM | POA: Diagnosis not present

## 2015-09-06 DIAGNOSIS — E785 Hyperlipidemia, unspecified: Secondary | ICD-10-CM | POA: Insufficient documentation

## 2015-09-06 DIAGNOSIS — M79605 Pain in left leg: Secondary | ICD-10-CM | POA: Diagnosis not present

## 2015-12-31 ENCOUNTER — Encounter: Payer: 59 | Admitting: Internal Medicine

## 2016-03-21 ENCOUNTER — Other Ambulatory Visit: Payer: Self-pay | Admitting: Internal Medicine

## 2016-04-01 ENCOUNTER — Ambulatory Visit (INDEPENDENT_AMBULATORY_CARE_PROVIDER_SITE_OTHER): Payer: 59 | Admitting: Internal Medicine

## 2016-04-01 DIAGNOSIS — Z111 Encounter for screening for respiratory tuberculosis: Secondary | ICD-10-CM | POA: Diagnosis not present

## 2016-04-01 DIAGNOSIS — Z23 Encounter for immunization: Secondary | ICD-10-CM

## 2016-04-01 NOTE — Progress Notes (Signed)
NURSE VISIT

## 2016-04-03 ENCOUNTER — Encounter: Payer: Self-pay | Admitting: Internal Medicine

## 2016-04-03 ENCOUNTER — Ambulatory Visit (INDEPENDENT_AMBULATORY_CARE_PROVIDER_SITE_OTHER): Payer: 59 | Admitting: Internal Medicine

## 2016-04-03 DIAGNOSIS — Z0000 Encounter for general adult medical examination without abnormal findings: Secondary | ICD-10-CM | POA: Diagnosis not present

## 2016-04-03 DIAGNOSIS — F900 Attention-deficit hyperactivity disorder, predominantly inattentive type: Secondary | ICD-10-CM | POA: Diagnosis not present

## 2016-04-03 DIAGNOSIS — F39 Unspecified mood [affective] disorder: Secondary | ICD-10-CM

## 2016-04-03 DIAGNOSIS — E291 Testicular hypofunction: Secondary | ICD-10-CM

## 2016-04-03 LAB — TB SKIN TEST
INDURATION: 0 mm
TB Skin Test: NEGATIVE

## 2016-04-03 NOTE — Assessment & Plan Note (Signed)
He is going to try to wean off the citalopram

## 2016-04-03 NOTE — Progress Notes (Signed)
Pre visit review using our clinic review tool, if applicable. No additional management support is needed unless otherwise documented below in the visit note. 

## 2016-04-03 NOTE — Assessment & Plan Note (Signed)
Donates blood regularly Will review his labs

## 2016-04-03 NOTE — Assessment & Plan Note (Signed)
Still on adderall-- other doctor gives it

## 2016-04-03 NOTE — Assessment & Plan Note (Signed)
Satisfied with the testosterone 

## 2016-04-03 NOTE — Progress Notes (Signed)
Subjective:    Patient ID: Logan Wise, male    DOB: Dec 27, 1960, 55 y.o.   MRN: [id number]  HPI Here for physical  Biggest issue is his knees Getting [facility] at [facility] Pain is some better Can't run and has to be careful with activities Still works out regularly---bike, weights Had to have left ankle surgery in November  Still sees Dr [facility]ana Simas--- [facility] Still on testosterone and arimidex Recent blood work including PSA---all good  Feels his mood is better Wants to try to titrate off the citalopram  Current Outpatient Prescriptions on File Prior to Visit  Medication Sig Dispense Refill  . albuterol (VENTOLIN HFA) 108 (90 BASE) MCG/ACT inhaler INHALE 2 PUFFS THREE TIMES A DAY AS NEEDED FOR ASTHMA PROBLEMS 36 g 2  . anastrozole (ARIMIDEX) 1 MG tablet Take 1 tablet by mouth once a week.    . CIALIS 5 MG tablet Take 5 mg by mouth daily as needed.     . citalopram (CELEXA) 20 MG tablet take 1 tablet by mouth once daily 90 tablet 0  . testosterone cypionate (DEPOTESTOTERONE CYPIONATE) 200 MG/ML injection Inject into the muscle every 14 (fourteen) days.       No current facility-administered medications on file prior to visit.     Allergies  Allergen Reactions  . Amoxicillin-Pot Clavulanate     Hives  . Peanut-Containing Drug Products     Past Medical History:  Diagnosis Date  . Allergy   . Anxiety   . Arthritis   . Asthma   . Depression   . GERD (gastroesophageal reflux disease)   . Hyperlipidemia   . Hypothyroid 2011  . Low testosterone     Past Surgical History:  Procedure Laterality Date  . ANKLE RECONSTRUCTION Left 05/2015  . HERNIA REPAIR  05/29/14  . KNEE ARTHROSCOPY     right  . NASAL STENOSIS REPAIR    . ROTATOR CUFF REPAIR     left 12/07, right 3/05 ([facility])  . SEPTOPLASTY      Family History  Problem Relation Age of Onset  . Cancer Mother     cervical  . Diabetes Father   . Heart disease Father   .  Hypertension Father     Social History   Social History  . Marital status: Married    Spouse name: N/A  . Number of children: 3  . Years of education: N/A   Occupational History  . outside [place] History Main Topics  . Smoking status: Former Research scientist (life sciences)  . Smokeless tobacco: Never Used  . Alcohol use 0.0 oz/week  . Drug use: No  . Sexual activity: Not on file   Other Topics Concern  . Not on file   Social History Narrative  . No narrative on file   Review of Systems  Constitutional:       Has lost some weight Wears seat belt  HENT: Negative for dental problem, hearing loss and tinnitus.        Keeps up with dentist  Eyes: Negative for visual disturbance.       No diplopia or unilateral vision loss  Respiratory: Negative for cough, chest tightness and shortness of breath.   Cardiovascular: Negative for chest pain, palpitations and leg swelling.  Gastrointestinal: Negative for abdominal pain, blood in stool, constipation, nausea and vomiting.       No heartburn  Endocrine: Negative for polydipsia and polyuria.  Genitourinary: Negative for  difficulty urinating, frequency and urgency.       No sexual problems  Musculoskeletal: Negative for arthralgias and joint swelling.       Some low back pain  Skin: Negative for rash.       No suspicious lesions---yearly visits with Dr Nehemiah Massed  Allergic/Immunologic: Negative for environmental allergies and immunocompromised state.  Neurological: Negative for dizziness, syncope, light-headedness and headaches.  Hematological: Negative for adenopathy. Does not bruise/bleed easily.  Psychiatric/Behavioral: Positive for sleep disturbance. Negative for dysphoric mood. The patient is not nervous/anxious.        Gets numbness in arms during sleep--that awakens him       Objective:   Physical Exam  Constitutional: He is oriented to person, place, and time. He appears well-developed and well-nourished. No distress.    HENT:  Head: Normocephalic and atraumatic.  Right Ear: External ear normal.  Left Ear: External ear normal.  Mouth/Throat: Oropharynx is clear and moist. No oropharyngeal exudate.  Eyes: Conjunctivae are normal. Pupils are equal, round, and reactive to light.  Neck: Normal range of motion. Neck supple. No thyromegaly present.  Cardiovascular: Normal rate, regular rhythm, normal heart sounds and intact distal pulses.  Exam reveals no gallop.   No murmur heard. Pulmonary/Chest: Effort normal and breath sounds normal. No respiratory distress. He has no wheezes. He has no rales.  Abdominal: Soft. There is no tenderness.  Musculoskeletal: He exhibits no edema.  Lymphadenopathy:    He has no cervical adenopathy.  Neurological: He is alert and oriented to person, place, and time.  Skin: No rash noted. No erythema.  Psychiatric: He has a normal mood and affect. His behavior is normal.          Assessment & Plan:

## 2016-04-03 NOTE — Assessment & Plan Note (Signed)
Healthy Colon due 2026 Just had PSA--he will get me copies of labs Had flu vaccine Works out

## 2016-04-03 NOTE — Patient Instructions (Signed)
Please decrease the citalopram to 10mg  on even days and 20 on odd days for 1 month. Then, if you are okay, cut to 10mg  daily for 1-2 months. If still doing well, can change to 10mg  every other day for 1-2 months and then stop.

## 2016-04-09 ENCOUNTER — Telehealth: Payer: Self-pay | Admitting: Internal Medicine

## 2016-04-09 NOTE — Telephone Encounter (Signed)
Pt dropped off lab results and records from Dr. Deirdre Pippins. I labeled and placed on cart for your review.

## 2016-04-10 NOTE — Telephone Encounter (Signed)
Spoke to pt

## 2016-04-10 NOTE — Telephone Encounter (Signed)
Please let him know I did review the notes and labs. There was a normal PSA in there--that is reassuring.

## 2016-07-29 DIAGNOSIS — M9903 Segmental and somatic dysfunction of lumbar region: Secondary | ICD-10-CM | POA: Diagnosis not present

## 2016-07-29 DIAGNOSIS — M5432 Sciatica, left side: Secondary | ICD-10-CM | POA: Diagnosis not present

## 2016-07-29 DIAGNOSIS — M9905 Segmental and somatic dysfunction of pelvic region: Secondary | ICD-10-CM | POA: Diagnosis not present

## 2016-08-04 DIAGNOSIS — M9903 Segmental and somatic dysfunction of lumbar region: Secondary | ICD-10-CM | POA: Diagnosis not present

## 2016-08-04 DIAGNOSIS — M5432 Sciatica, left side: Secondary | ICD-10-CM | POA: Diagnosis not present

## 2016-08-04 DIAGNOSIS — M9905 Segmental and somatic dysfunction of pelvic region: Secondary | ICD-10-CM | POA: Diagnosis not present

## 2016-08-08 DIAGNOSIS — M9903 Segmental and somatic dysfunction of lumbar region: Secondary | ICD-10-CM | POA: Diagnosis not present

## 2016-08-08 DIAGNOSIS — M9905 Segmental and somatic dysfunction of pelvic region: Secondary | ICD-10-CM | POA: Diagnosis not present

## 2016-08-08 DIAGNOSIS — M5432 Sciatica, left side: Secondary | ICD-10-CM | POA: Diagnosis not present

## 2016-08-12 ENCOUNTER — Telehealth: Payer: Self-pay

## 2016-08-12 DIAGNOSIS — F900 Attention-deficit hyperactivity disorder, predominantly inattentive type: Secondary | ICD-10-CM

## 2016-08-12 NOTE — Telephone Encounter (Signed)
Pt left v/m; pts job requires pt to have a 10 panel drug screen; pt request order to be put in to have drug screen. Last annual visit 04/03/16.

## 2016-08-13 ENCOUNTER — Other Ambulatory Visit: Payer: 59

## 2016-08-13 DIAGNOSIS — J4520 Mild intermittent asthma, uncomplicated: Secondary | ICD-10-CM | POA: Diagnosis not present

## 2016-08-13 DIAGNOSIS — J069 Acute upper respiratory infection, unspecified: Secondary | ICD-10-CM | POA: Diagnosis not present

## 2016-08-13 NOTE — Telephone Encounter (Signed)
Spoke to pt. Made lab appt. 

## 2016-08-13 NOTE — Telephone Encounter (Signed)
Order placed He can come in to give the urine sample

## 2016-08-14 ENCOUNTER — Other Ambulatory Visit: Payer: 59

## 2016-08-14 DIAGNOSIS — F900 Attention-deficit hyperactivity disorder, predominantly inattentive type: Secondary | ICD-10-CM

## 2016-08-20 LAB — TOXASSURE SELECT 13 (MW), URINE

## 2016-08-25 DIAGNOSIS — J309 Allergic rhinitis, unspecified: Secondary | ICD-10-CM | POA: Diagnosis not present

## 2016-08-25 DIAGNOSIS — H903 Sensorineural hearing loss, bilateral: Secondary | ICD-10-CM | POA: Diagnosis not present

## 2016-08-25 DIAGNOSIS — H6980 Other specified disorders of Eustachian tube, unspecified ear: Secondary | ICD-10-CM | POA: Diagnosis not present

## 2016-08-27 ENCOUNTER — Telehealth: Payer: Self-pay | Admitting: Internal Medicine

## 2016-08-27 ENCOUNTER — Encounter: Payer: Self-pay | Admitting: Internal Medicine

## 2016-08-27 NOTE — Telephone Encounter (Signed)
Spoke to pt and advised letter is available; prefers to pickup from office

## 2016-08-27 NOTE — Telephone Encounter (Signed)
Please send him the letter

## 2016-08-27 NOTE — Telephone Encounter (Signed)
Patient needs letter for 10 panel drug screen stating he passed with date in letter head. Placed papework in [place].

## 2016-08-28 DIAGNOSIS — H903 Sensorineural hearing loss, bilateral: Secondary | ICD-10-CM | POA: Diagnosis not present

## 2016-09-22 ENCOUNTER — Other Ambulatory Visit: Payer: Self-pay

## 2016-09-22 MED ORDER — AMPHETAMINE-DEXTROAMPHETAMINE 20 MG PO TABS: 20.0000 mg | ORAL_TABLET | Freq: Every day | ORAL | 0 refills | Status: DC

## 2016-09-22 NOTE — Telephone Encounter (Signed)
Does this note mean he wants the brand ADDERALL? Let him know I don't do more than 1 month of any controlled substance anymore Okay to print #30 x 0 of the brand or generic (his preference)

## 2016-09-22 NOTE — Telephone Encounter (Signed)
Patient returned Shannon's call.  Patient said he wants to do Generic and he uses [facility]. I let him know it would be for #30.

## 2016-09-22 NOTE — Telephone Encounter (Signed)
Left message on v/m to call back

## 2016-09-22 NOTE — Telephone Encounter (Signed)
rx printed and waiting for signature

## 2016-09-22 NOTE — Telephone Encounter (Signed)
Left a detailed message on VM per DPR that rx is up front ready for pickup

## 2016-09-22 NOTE — Telephone Encounter (Signed)
Pt left v/m requesting 3 month supply of adderall 20 mg. Dr Sharol Roussel previously wrote adderall. Pt wants Dr Silvio Pate to write adderall.pt last annual 04/03/16. Pt request cb when ready for piclk up.

## 2016-10-13 ENCOUNTER — Telehealth: Payer: Self-pay | Admitting: Internal Medicine

## 2016-10-13 NOTE — Telephone Encounter (Signed)
Pt has appt 10/14/16 at 8:30 with Dr Damita Dunnings.

## 2016-10-13 NOTE — Telephone Encounter (Signed)
Patient Name: Logan Wise  DOB: January 02, 1961    Initial Comment caller states he has had vertigo for 8 straight days an brain fog. He has had dizziness. He believes its stems from a concussion he had 8 years ago    Nurse Assessment  Nurse: Raphael Gibney, RN, Vanita Ingles Date/Time (Eastern Time): 10/13/2016 11:21:45 AM  Confirm and document reason for call. If symptomatic, describe symptoms. ---Caller states he has had vertigo for 8 days. Feels like he is in a fog. Had concussion 8 years ago. He is nauseated from the vertigo  Does the patient have any new or worsening symptoms? ---Yes  Will a triage be completed? ---Yes  Related visit to physician within the last 2 weeks? ---No  Does the PT have any chronic conditions? (i.e. diabetes, asthma, etc.) ---Yes  List chronic conditions. ---concussion 8 years ago  Is this a behavioral health or substance abuse call? ---No     Guidelines    Guideline Title Affirmed Question Affirmed Notes  Dizziness - Vertigo [1] MODERATE dizziness (e.g., vertigo; feels very unsteady, interferes with normal activities) AND [2] has NOT been evaluated by physician for this    Final Disposition User   See Physician within 24 Hours [place], RN, Vanita Ingles    Comments  appt scheduled for 10/14/2016 at 8:30 am with Dr. Elsie Stain   Referrals  REFERRED TO PCP OFFICE   Disagree/Comply: Comply

## 2016-10-14 ENCOUNTER — Encounter: Payer: Self-pay | Admitting: Family Medicine

## 2016-10-14 ENCOUNTER — Ambulatory Visit (INDEPENDENT_AMBULATORY_CARE_PROVIDER_SITE_OTHER): Payer: 59 | Admitting: Family Medicine

## 2016-10-14 VITALS — BP 150/88 | HR 83 | Temp 97.9°F | Wt 221.5 lb

## 2016-10-14 DIAGNOSIS — R42 Dizziness and giddiness: Secondary | ICD-10-CM

## 2016-10-14 MED ORDER — MECLIZINE HCL 25 MG PO TABS: 12.5000 mg | ORAL_TABLET | Freq: Three times a day (TID) | ORAL | Status: DC | PRN

## 2016-10-14 NOTE — Progress Notes (Signed)
Pre visit review using our clinic review tool, if applicable. No additional management support is needed unless otherwise documented below in the visit note. 

## 2016-10-14 NOTE — Progress Notes (Signed)
He had a concussion about 8 years ago.  He was knocked out with head injury (baseball).  Had light sensitivity, etc, after the event, including vertigo.    In retrospect, this was likely his 3rd concussion at the time.  Most sx gradually got better but then he had transient vertigo in the years since but it would usually only last about 1 day.  He would refer to these as his "bad days". This is in addition to his nighttime sx- see below.    Now, every night for the last 8 years, if he lays on back and rotates to right, then he'll get vertigo.  This is his baseline.   This is "normal" for him.   Now for 8 days with more sx on top of that.  He has had constant sx for the last 8 days, along with "brain fog."   Vertigo, feeling of motion sickness, "when I turn my head, it takes a while for my eyes to catch up."  This is like his prev "bad day" episodes but now about 8 days in a row.    No injury recently.  No meds tried recently.    He didn't have any of these sx prior to concussion.    He prev had epley maneuvers, the 1st one helped, the 2nd and 3rd didn't. Prev done by [facility].  He saw ENT about 3 weeks ago, but this current spell wasn't going on at that point.   PMH and SH reviewed  ROS: Per HPI unless specifically indicated in ROS section   Meds, vitals, and allergies reviewed.   GEN: nad, alert and oriented HEENT: mucous membranes moist NECK: supple w/o LA CV: rrr.  PULM: ctab, no inc wob ABD: soft, +bs EXT: no edema SKIN: no acute rash CN 2-12 wnl B, S/S/DTR wnl x4 DHP not done as it would likely be positive and wouldn't change mgmt

## 2016-10-14 NOTE — Patient Instructions (Signed)
You can try OTC meclizine but it may make you drowsy.  Rosaria Ferries will call about your referral. Take care.  Glad to see you.  We may need to get you set up with neurology or ENT after the imaging is done.

## 2016-10-15 ENCOUNTER — Encounter: Payer: Self-pay | Admitting: Family Medicine

## 2016-10-15 DIAGNOSIS — R42 Dizziness and giddiness: Secondary | ICD-10-CM | POA: Insufficient documentation

## 2016-10-15 NOTE — Assessment & Plan Note (Addendum)
Recurrent, overall worsened recently though some better today. Presume some relation to previous concussion. At this point he needs MR brain. We need to exclude any structural lesion. Discussed with patient. He agrees. At this point still okay for outpatient follow-up. Okay to try meclizine in the meantime. Routine cautions given. He may end up needing to see neurology or ENT after the MRIs done. Discussed with PCP at office visit. >25 minutes spent in face to face time with patient, >50% spent in counselling or coordination of care.

## 2016-10-30 ENCOUNTER — Ambulatory Visit
Admission: RE | Admit: 2016-10-30 | Discharge: 2016-10-30 | Disposition: A | Discharge status: Home / Self Care | Payer: 59 | Source: Ambulatory Visit | Attending: Family Medicine | Admitting: Family Medicine

## 2016-10-30 DIAGNOSIS — S0990XA Unspecified injury of head, initial encounter: Secondary | ICD-10-CM | POA: Diagnosis not present

## 2016-10-30 DIAGNOSIS — R42 Dizziness and giddiness: Secondary | ICD-10-CM

## 2016-11-04 ENCOUNTER — Other Ambulatory Visit: Payer: Self-pay

## 2016-11-04 MED ORDER — AMPHETAMINE-DEXTROAMPHETAMINE 20 MG PO TABS: 20.0000 mg | ORAL_TABLET | Freq: Every day | ORAL | 0 refills | Status: DC

## 2016-11-04 NOTE — Telephone Encounter (Signed)
Pt left v/m requesting rx for Adderall. Call when ready for pick up. rx last printed # 30 on 09/22/16; last annual 04/03/16. Pt took last adderall this AM.

## 2016-11-04 NOTE — Telephone Encounter (Signed)
Spoke to pt. Rx up front ready for pickup 

## 2016-11-07 ENCOUNTER — Other Ambulatory Visit: Payer: Self-pay

## 2016-11-07 MED ORDER — ALBUTEROL SULFATE HFA 108 (90 BASE) MCG/ACT IN AERS: INHALATION_SPRAY | RESPIRATORY_TRACT | 2 refills | Status: DC

## 2016-11-07 NOTE — Telephone Encounter (Signed)
Pt left v/m requesting refill albuterol to [facility].last annual 04/03/16. Refilled per protocol.per DPR  Left pt v/m to ck with [facility].

## 2016-11-13 DIAGNOSIS — M1712 Unilateral primary osteoarthritis, left knee: Secondary | ICD-10-CM | POA: Diagnosis not present

## 2016-11-13 DIAGNOSIS — M1711 Unilateral primary osteoarthritis, right knee: Secondary | ICD-10-CM | POA: Diagnosis not present

## 2016-12-02 DIAGNOSIS — R42 Dizziness and giddiness: Secondary | ICD-10-CM | POA: Diagnosis not present

## 2016-12-11 ENCOUNTER — Other Ambulatory Visit: Payer: Self-pay

## 2016-12-11 MED ORDER — AMPHETAMINE-DEXTROAMPHETAMINE 20 MG PO TABS: 20.0000 mg | ORAL_TABLET | Freq: Every day | ORAL | 0 refills | Status: DC

## 2016-12-11 NOTE — Telephone Encounter (Signed)
Pt left v/m requesting rx for Adderall. Call when ready for pick up. Last printed # 30 on 11/04/16 and last seen 04/03/16 for annual.

## 2016-12-11 NOTE — Telephone Encounter (Signed)
Have him do UDS if not done in the past 9 months

## 2016-12-11 NOTE — Telephone Encounter (Signed)
Left message on vm per dpr that rx is up front ready for pickup

## 2017-01-14 DIAGNOSIS — L578 Other skin changes due to chronic exposure to nonionizing radiation: Secondary | ICD-10-CM | POA: Diagnosis not present

## 2017-01-14 DIAGNOSIS — L718 Other rosacea: Secondary | ICD-10-CM | POA: Diagnosis not present

## 2017-01-14 DIAGNOSIS — L820 Inflamed seborrheic keratosis: Secondary | ICD-10-CM | POA: Diagnosis not present

## 2017-01-14 DIAGNOSIS — L700 Acne vulgaris: Secondary | ICD-10-CM | POA: Diagnosis not present

## 2017-01-21 ENCOUNTER — Other Ambulatory Visit: Payer: Self-pay

## 2017-01-21 MED ORDER — AMPHETAMINE-DEXTROAMPHETAMINE 20 MG PO TABS: 20.0000 mg | ORAL_TABLET | Freq: Every day | ORAL | 0 refills | Status: DC

## 2017-01-21 NOTE — Telephone Encounter (Signed)
Left voicemail letting pt know Rx ready for pick up 

## 2017-01-21 NOTE — Telephone Encounter (Signed)
Px printed for pick up in IN box Refilled times one in pcp absence

## 2017-01-21 NOTE — Telephone Encounter (Signed)
Pt left v/m requesting rx for Adderall. Call when ready for pick up. Last printed # 30 on 12/11/16. Pt last seen annual 04/03/16 and pt scheduled for CPX on 04/14/17. Dr Silvio Pate out of office.Please advise.

## 2017-02-18 DIAGNOSIS — M25572 Pain in left ankle and joints of left foot: Secondary | ICD-10-CM | POA: Diagnosis not present

## 2017-02-18 DIAGNOSIS — M7989 Other specified soft tissue disorders: Secondary | ICD-10-CM | POA: Diagnosis not present

## 2017-02-27 ENCOUNTER — Other Ambulatory Visit: Payer: Self-pay | Admitting: *Deleted

## 2017-02-27 DIAGNOSIS — M19072 Primary osteoarthritis, left ankle and foot: Secondary | ICD-10-CM | POA: Diagnosis not present

## 2017-02-27 DIAGNOSIS — R600 Localized edema: Secondary | ICD-10-CM | POA: Diagnosis not present

## 2017-02-27 DIAGNOSIS — M659 Synovitis and tenosynovitis, unspecified: Secondary | ICD-10-CM | POA: Diagnosis not present

## 2017-02-27 MED ORDER — AMPHETAMINE-DEXTROAMPHETAMINE 20 MG PO TABS: 20.0000 mg | ORAL_TABLET | Freq: Every day | ORAL | 0 refills | Status: DC

## 2017-02-27 NOTE — Telephone Encounter (Signed)
Spoke to pt. Rx up front ready for pickup 

## 2017-02-27 NOTE — Telephone Encounter (Signed)
Patient left a voicemail stating that he just got back early this morning from meetings in [facility] and is completely out of his Adderall Last refill 01/21/17 #30 Last office visit 10/14/16/acute

## 2017-03-16 ENCOUNTER — Ambulatory Visit (INDEPENDENT_AMBULATORY_CARE_PROVIDER_SITE_OTHER): Payer: 59 | Admitting: Internal Medicine

## 2017-03-16 ENCOUNTER — Encounter: Payer: Self-pay | Admitting: Internal Medicine

## 2017-03-16 VITALS — BP 130/72 | HR 55 | Temp 97.6°F | Wt 221.8 lb

## 2017-03-16 DIAGNOSIS — J4521 Mild intermittent asthma with (acute) exacerbation: Secondary | ICD-10-CM | POA: Diagnosis not present

## 2017-03-16 DIAGNOSIS — J45909 Unspecified asthma, uncomplicated: Secondary | ICD-10-CM | POA: Insufficient documentation

## 2017-03-16 MED ORDER — PREDNISONE 20 MG PO TABS: 40.0000 mg | ORAL_TABLET | Freq: Every day | ORAL | 0 refills | Status: DC

## 2017-03-16 NOTE — Progress Notes (Signed)
   Subjective:    Patient ID: Logan Wise, male    DOB: [birth year], 56 y.o.   MRN: [id number]  HPI Here due to cough  Caught infection from his wife Started 4-5 days ago Tight cough Using inhaler---helps some No fever Slight SOB from asthma--occasional asthma  No other Rx--except Mucinex  Current Outpatient Prescriptions on File Prior to Visit  Medication Sig Dispense Refill  . albuterol (VENTOLIN HFA) 108 (90 Base) MCG/ACT inhaler INHALE 2 PUFFS THREE TIMES A DAY AS NEEDED FOR ASTHMA PROBLEMS 36 g 2  . amphetamine-dextroamphetamine (ADDERALL) 20 MG tablet Take 1 tablet (20 mg total) by mouth daily. 30 tablet 0  . anastrozole (ARIMIDEX) 1 MG tablet Take 1 tablet by mouth once a week.    . CIALIS 5 MG tablet Take 5 mg by mouth daily as needed.     . meclizine (ANTIVERT) 25 MG tablet Take 0.5-1 tablets (12.5-25 mg total) by mouth 3 (three) times daily as needed for dizziness (sedation caution).    Marland Kitchen testosterone cypionate (DEPOTESTOTERONE CYPIONATE) 200 MG/ML injection Inject into the muscle every 14 (fourteen) days.       No current facility-administered medications on file prior to visit.     Allergies  Allergen Reactions  . Amoxicillin-Pot Clavulanate     Hives  . Peanut-Containing Drug Products     Past Medical History:  Diagnosis Date  . Allergy   . Anxiety   . Arthritis   . Asthma   . Depression   . GERD (gastroesophageal reflux disease)   . Hyperlipidemia   . Hypothyroid 2011  . Low testosterone     Past Surgical History:  Procedure Laterality Date  . ANKLE RECONSTRUCTION Left 05/2015  . HERNIA REPAIR  05/29/14  . KNEE ARTHROSCOPY     right  . NASAL STENOSIS REPAIR    . ROTATOR CUFF REPAIR     left 12/07, right 3/05 ([facility])  . SEPTOPLASTY      Family History  Problem Relation Age of Onset  . Cancer Mother        cervical  . Diabetes Father   . Heart disease Father   . Hypertension Father     Social History   Social History  .  Marital status: Married    Spouse name: N/A  . Number of children: 3  . Years of education: N/A   Occupational History  . outside [place] History Main Topics  . Smoking status: Former Research scientist (life sciences)  . Smokeless tobacco: Never Used  . Alcohol use 0.0 oz/week  . Drug use: No  . Sexual activity: Not on file   Other Topics Concern  . Not on file   Social History Narrative  . No narrative on file   Review of Systems No vomiting or diarrhea Appetite is okay No rash    Objective:   Physical Exam  Constitutional: No distress.  HENT:  Mouth/Throat: No oropharyngeal exudate.  No sinus tenderness TMs normal Slight nasal swelling  Neck: No thyromegaly present.  Pulmonary/Chest: Effort normal and breath sounds normal. No respiratory distress. He has no wheezes. He has no rales.  Lymphadenopathy:    He has no cervical adenopathy.          Assessment & Plan:

## 2017-03-16 NOTE — Assessment & Plan Note (Signed)
Discussed that this is probably viral (or possibly atypical) Will try prednisone for 3 days If cough, etc are persistent (like into next week), would try doxy or z-pak

## 2017-03-19 DIAGNOSIS — M1712 Unilateral primary osteoarthritis, left knee: Secondary | ICD-10-CM | POA: Diagnosis not present

## 2017-03-19 DIAGNOSIS — M1711 Unilateral primary osteoarthritis, right knee: Secondary | ICD-10-CM | POA: Diagnosis not present

## 2017-03-23 DIAGNOSIS — I789 Disease of capillaries, unspecified: Secondary | ICD-10-CM | POA: Diagnosis not present

## 2017-03-23 DIAGNOSIS — L719 Rosacea, unspecified: Secondary | ICD-10-CM | POA: Diagnosis not present

## 2017-03-26 DIAGNOSIS — M1712 Unilateral primary osteoarthritis, left knee: Secondary | ICD-10-CM | POA: Diagnosis not present

## 2017-03-26 DIAGNOSIS — M1711 Unilateral primary osteoarthritis, right knee: Secondary | ICD-10-CM | POA: Diagnosis not present

## 2017-03-31 ENCOUNTER — Other Ambulatory Visit: Payer: Self-pay

## 2017-03-31 MED ORDER — AMPHETAMINE-DEXTROAMPHETAMINE 20 MG PO TABS: 20.0000 mg | ORAL_TABLET | Freq: Every day | ORAL | 0 refills | Status: DC

## 2017-03-31 NOTE — Telephone Encounter (Signed)
Pt left v/m requesting rx for Adderall. Call when ready for pick up. Last printed # 30 on 02/27/17; last annual 04/03/16 and pt scheduled CPX on 04/14/17.

## 2017-03-31 NOTE — Telephone Encounter (Signed)
Patient notified by telephone that script is up front ready for pickup. 

## 2017-04-02 DIAGNOSIS — M1712 Unilateral primary osteoarthritis, left knee: Secondary | ICD-10-CM | POA: Diagnosis not present

## 2017-04-02 DIAGNOSIS — M1711 Unilateral primary osteoarthritis, right knee: Secondary | ICD-10-CM | POA: Diagnosis not present

## 2017-04-09 DIAGNOSIS — M7989 Other specified soft tissue disorders: Secondary | ICD-10-CM | POA: Diagnosis not present

## 2017-04-14 ENCOUNTER — Encounter: Payer: 59 | Admitting: Internal Medicine

## 2017-05-06 DIAGNOSIS — R2242 Localized swelling, mass and lump, left lower limb: Secondary | ICD-10-CM | POA: Diagnosis not present

## 2017-05-06 DIAGNOSIS — M722 Plantar fascial fibromatosis: Secondary | ICD-10-CM | POA: Diagnosis not present

## 2017-05-06 DIAGNOSIS — L923 Foreign body granuloma of the skin and subcutaneous tissue: Secondary | ICD-10-CM | POA: Diagnosis not present

## 2017-05-07 ENCOUNTER — Other Ambulatory Visit: Payer: Self-pay | Admitting: Internal Medicine

## 2017-05-07 MED ORDER — AMPHETAMINE-DEXTROAMPHETAMINE 20 MG PO TABS: 20.0000 mg | ORAL_TABLET | Freq: Every day | ORAL | 0 refills | Status: DC

## 2017-05-07 NOTE — Telephone Encounter (Signed)
Left message on vm that rx up front ready for pickup

## 2017-05-07 NOTE — Telephone Encounter (Signed)
Last OV 03/16/2017. Last Rx 03/31/17

## 2017-05-07 NOTE — Telephone Encounter (Signed)
Copied from [place]. Topic: Quick Communication - See Telephone Encounter >> May 07, 2017  8:53 AM Robina Ade, Helene Kelp D wrote: CRM for notification. See Telephone encounter for: 05/07/17. Patient needs refill on his Adderall sent to [facility] on [address].

## 2017-05-07 NOTE — Telephone Encounter (Signed)
Controlled substance 

## 2017-05-12 ENCOUNTER — Encounter: Payer: 59 | Admitting: Internal Medicine

## 2017-05-13 DIAGNOSIS — M1711 Unilateral primary osteoarthritis, right knee: Secondary | ICD-10-CM | POA: Diagnosis not present

## 2017-05-13 DIAGNOSIS — M7651 Patellar tendinitis, right knee: Secondary | ICD-10-CM | POA: Diagnosis not present

## 2017-05-13 DIAGNOSIS — M1712 Unilateral primary osteoarthritis, left knee: Secondary | ICD-10-CM | POA: Diagnosis not present

## 2017-06-05 ENCOUNTER — Ambulatory Visit: Payer: 59 | Admitting: Internal Medicine

## 2017-06-05 ENCOUNTER — Encounter: Payer: Self-pay | Admitting: Internal Medicine

## 2017-06-05 VITALS — BP 136/80 | HR 76 | Temp 97.9°F | Wt 229.0 lb

## 2017-06-05 DIAGNOSIS — B349 Viral infection, unspecified: Secondary | ICD-10-CM | POA: Diagnosis not present

## 2017-06-05 LAB — COMPREHENSIVE METABOLIC PANEL
ALT: 19 U/L (ref 0–53)
AST: 18 U/L (ref 0–37)
Albumin: 4.5 g/dL (ref 3.5–5.2)
Alkaline Phosphatase: 43 U/L (ref 39–117)
BILIRUBIN TOTAL: 0.9 mg/dL (ref 0.2–1.2)
BUN: 20 mg/dL (ref 6–23)
CO2: 31 meq/L (ref 19–32)
CREATININE: 0.94 mg/dL (ref 0.40–1.50)
Calcium: 9.4 mg/dL (ref 8.4–10.5)
Chloride: 101 mEq/L (ref 96–112)
GFR: 88.11 mL/min (ref >60.00)
GLUCOSE: 103 mg/dL — AB (ref 70–99)
Potassium: 4.4 mEq/L (ref 3.5–5.1)
Sodium: 137 mEq/L (ref 135–145)
Total Protein: 6.6 g/dL (ref 6.0–8.3)

## 2017-06-05 LAB — CBC
HCT: 41.6 % (ref 39.0–52.0)
Hemoglobin: 14.4 g/dL (ref 13.0–17.0)
MCHC: 34.6 g/dL (ref 30.0–36.0)
MCV: 93.3 fl (ref 78.0–100.0)
PLATELETS: 182 10*3/uL (ref 150.0–400.0)
RBC: 4.46 Mil/uL (ref 4.22–5.81)
RDW: 12.4 % (ref 11.5–15.5)
WBC: 4 10*3/uL (ref 4.0–10.5)

## 2017-06-05 LAB — SEDIMENTATION RATE: SED RATE: 0 mm/h (ref 0–20)

## 2017-06-05 MED ORDER — AMPHETAMINE-DEXTROAMPHETAMINE 20 MG PO TABS: 20.0000 mg | ORAL_TABLET | Freq: Every day | ORAL | 0 refills | Status: DC

## 2017-06-05 MED ORDER — TADALAFIL 5 MG PO TABS: 5.0000 mg | ORAL_TABLET | Freq: Every day | ORAL | 11 refills | Status: DC

## 2017-06-05 NOTE — Addendum Note (Signed)
Addended by: Ellamae Sia on: 06/05/2017 12:45 PM   Modules accepted: Orders

## 2017-06-05 NOTE — Progress Notes (Signed)
Subjective:    Patient ID: Logan Wise, male    DOB: 07/13/1960, 56 y.o.   MRN: [id number]  HPI Here due to fatigue  Has "been through some serious crap" for 4-6 weeks Flu like symptoms for 2-4 weeks  Excessive fatigue--falling asleep at his desk Aching in joints--like severe flu Incredible brain fog No stamina--- 3 minutes on treadmill and then have to stop The worst was for 2 weeks and now slowly improving  Went to sports med doctor and discussed this Got blood work--fairly unremarkable (6 weeks ago--- "in the throes of it") Low grade asthma during this time  Other people at gym had similar symptoms  Current Outpatient Medications on File Prior to Visit  Medication Sig Dispense Refill  . albuterol (VENTOLIN HFA) 108 (90 Base) MCG/ACT inhaler INHALE 2 PUFFS THREE TIMES A DAY AS NEEDED FOR ASTHMA PROBLEMS 36 g 2  . amphetamine-dextroamphetamine (ADDERALL) 20 MG tablet Take 1 tablet (20 mg total) daily by mouth. 30 tablet 0  . anastrozole (ARIMIDEX) 1 MG tablet Take 1 tablet by mouth once a week.    . CIALIS 5 MG tablet Take 5 mg by mouth daily as needed.     . testosterone cypionate (DEPOTESTOTERONE CYPIONATE) 200 MG/ML injection Inject into the muscle every 14 (fourteen) days.       No current facility-administered medications on file prior to visit.     Allergies  Allergen Reactions  . Amoxicillin-Pot Clavulanate     Hives  . Peanut-Containing Drug Products     Past Medical History:  Diagnosis Date  . Allergy   . Anxiety   . Arthritis   . Asthma   . Depression   . GERD (gastroesophageal reflux disease)   . Hyperlipidemia   . Hypothyroid 2011  . Low testosterone     Past Surgical History:  Procedure Laterality Date  . ANKLE RECONSTRUCTION Left 05/2015  . HERNIA REPAIR  05/29/14  . KNEE ARTHROSCOPY     right  . NASAL STENOSIS REPAIR    . ROTATOR CUFF REPAIR     left 12/07, right 3/05 ([facility])  . SEPTOPLASTY      Family History  Problem  Relation Age of Onset  . Cancer Mother        cervical  . Diabetes Father   . Heart disease Father   . Hypertension Father     Social History   Socioeconomic History  . Marital status: Married    Spouse name: Not on file  . Number of children: 3  . Years of education: Not on file  . Highest education level: Not on file  Social Needs  . Financial resource strain: Not on file  . Food insecurity - worry: Not on file  . Food insecurity - inability: Not on file  . Transportation needs - medical: Not on file  . Transportation needs - non-medical: Not on file  Occupational History  . Occupation: outside Press photographer    Comment: [employer]  Tobacco Use  . Smoking status: Former Research scientist (life sciences)  . Smokeless tobacco: Never Used  Substance and Sexual Activity  . Alcohol use: Yes    Alcohol/week: 0.0 oz  . Drug use: No  . Sexual activity: Not on file  Other Topics Concern  . Not on file  Social History Narrative  . Not on file   Review of Systems No longer seeing Dr Vaughn---integrated health Monogamous with wife--no known HIV risk factors Eating okay--weight actually up No N/V Bowels are okay  No regular cough now Breathing is improving--but still using inhaler close to daily No abdominal pain No fever throughout all this Marriage is fine Has cut back on his testosterone dose--down to 100mg  every 2 weeks Knees are worse now after synvisc injections Vertigo acting up since this started Continues on the adderall every day    Objective:   Physical Exam  Constitutional: He appears well-developed. No distress.  HENT:  Mouth/Throat: Oropharynx is clear and moist. No oropharyngeal exudate.  Neck: No thyromegaly present.  Cardiovascular: Normal rate, regular rhythm, normal heart sounds and intact distal pulses. Exam reveals no gallop and no friction rub.  No murmur heard. Pulmonary/Chest: Effort normal and breath sounds normal. No respiratory distress. He has no wheezes. He has no rales.    Abdominal: Soft. He exhibits no distension. There is no tenderness. There is no rebound and no guarding.  No HSM  Musculoskeletal: He exhibits no edema or tenderness.  No joint swelling  Lymphadenopathy:       Head (right side): No submental, no submandibular, no tonsillar, no preauricular, no posterior auricular and no occipital adenopathy present.       Head (left side): No submental, no submandibular, no tonsillar, no preauricular, no posterior auricular and no occipital adenopathy present.    He has no cervical adenopathy.    He has no axillary adenopathy.       Right: No inguinal adenopathy present.       Left: No inguinal adenopathy present.  Skin: No rash noted.  Psychiatric: He has a normal mood and affect. His behavior is normal.          Assessment & Plan:

## 2017-06-05 NOTE — Assessment & Plan Note (Signed)
Has had fatigue, arthralgias, cognitive changes, etc etc Really bad for 2 weeks and now slowly improving Borderline low WBC 6 weeks ago--otherwise labs okay Actually gained weight  He wonders about Lyme--discussed this is not consistent Discussed HIV--no known risks but will check Not clear that we should try any treatment--supportive only No reason to think this is related to any of his meds Probably just need to wait it out

## 2017-06-06 LAB — HIV ANTIBODY (ROUTINE TESTING W REFLEX): HIV: NONREACTIVE

## 2017-06-17 DIAGNOSIS — M1711 Unilateral primary osteoarthritis, right knee: Secondary | ICD-10-CM | POA: Diagnosis not present

## 2017-06-17 DIAGNOSIS — M1712 Unilateral primary osteoarthritis, left knee: Secondary | ICD-10-CM | POA: Diagnosis not present

## 2017-07-06 ENCOUNTER — Other Ambulatory Visit: Payer: Self-pay | Admitting: Internal Medicine

## 2017-07-06 MED ORDER — AMPHETAMINE-DEXTROAMPHETAMINE 20 MG PO TABS: 20.0000 mg | ORAL_TABLET | Freq: Every day | ORAL | 0 refills | Status: DC

## 2017-07-06 NOTE — Telephone Encounter (Signed)
Spoke to pt. Rx up front.

## 2017-07-06 NOTE — Telephone Encounter (Signed)
Pt requesting rx for Adderall. Call when ready for pick up. Last printed # 30 on 06/05/17 pt last seen 06/05/17.Please advise.

## 2017-07-06 NOTE — Telephone Encounter (Signed)
Rx request 

## 2017-07-06 NOTE — Telephone Encounter (Signed)
Copied from [place]. Topic: Quick Communication - See Telephone Encounter >> Jul 06, 2017 11:17 AM Bea Graff, NT wrote: CRM for notification. See Telephone encounter for: Pt would like a refill of adderall. Uses [facility] in [place].   07/06/17.

## 2017-07-10 ENCOUNTER — Encounter: Payer: Self-pay | Admitting: Internal Medicine

## 2017-07-10 ENCOUNTER — Ambulatory Visit (INDEPENDENT_AMBULATORY_CARE_PROVIDER_SITE_OTHER): Payer: 59 | Admitting: Internal Medicine

## 2017-07-10 VITALS — BP 120/74 | HR 70 | Temp 97.7°F | Ht 71.0 in | Wt 222.0 lb

## 2017-07-10 DIAGNOSIS — F900 Attention-deficit hyperactivity disorder, predominantly inattentive type: Secondary | ICD-10-CM | POA: Diagnosis not present

## 2017-07-10 DIAGNOSIS — Z125 Encounter for screening for malignant neoplasm of prostate: Secondary | ICD-10-CM

## 2017-07-10 DIAGNOSIS — Z0000 Encounter for general adult medical examination without abnormal findings: Secondary | ICD-10-CM | POA: Diagnosis not present

## 2017-07-10 LAB — PSA: PSA: 0.53 ng/mL (ref 0.10–4.00)

## 2017-07-10 NOTE — Assessment & Plan Note (Signed)
Healthy Exercises regularly--weight down some Will check PSA Colon was only 2 years ago UTD on imms

## 2017-07-10 NOTE — Assessment & Plan Note (Signed)
Does okay with adderall Discussed trying to skip some days

## 2017-07-10 NOTE — Progress Notes (Signed)
Subjective:    Patient ID: Logan Wise, male    DOB: [birth year], 57 y.o.   MRN: [id number]  HPI Here for physical  Done with integrative doctor--wanted to start seeing him in her house! Plans to see endocrinologist to get the Rx again Has cut down on the testosterone-- 0.25-0.5cc weekly  Has some mood instability Up and down some   Uses the adderall regularly Gets some days of "breakthrough" Doing well at his job Takes it every day--discussed taking weekends off  Current Outpatient Medications on File Prior to Visit  Medication Sig Dispense Refill  . albuterol (VENTOLIN HFA) 108 (90 Base) MCG/ACT inhaler INHALE 2 PUFFS THREE TIMES A DAY AS NEEDED FOR ASTHMA PROBLEMS 36 g 2  . amphetamine-dextroamphetamine (ADDERALL) 20 MG tablet Take 1 tablet (20 mg total) by mouth daily. 30 tablet 0  . anastrozole (ARIMIDEX) 1 MG tablet Take 1 tablet by mouth once a week.    . tadalafil (CIALIS) 5 MG tablet Take 1 tablet (5 mg total) by mouth daily. 30 tablet 11  . testosterone cypionate (DEPOTESTOTERONE CYPIONATE) 200 MG/ML injection Inject into the muscle every 14 (fourteen) days.       No current facility-administered medications on file prior to visit.     Allergies  Allergen Reactions  . Amoxicillin-Pot Clavulanate     Hives  . Peanut-Containing Drug Products     Past Medical History:  Diagnosis Date  . Allergy   . Anxiety   . Arthritis   . Asthma   . Depression   . GERD (gastroesophageal reflux disease)   . Hyperlipidemia   . Hypothyroid 2011  . Low testosterone     Past Surgical History:  Procedure Laterality Date  . ANKLE RECONSTRUCTION Left 05/2015  . HERNIA REPAIR  05/29/14  . KNEE ARTHROSCOPY     right  . NASAL STENOSIS REPAIR    . ROTATOR CUFF REPAIR     left 12/07, right 3/05 ([facility])  . SEPTOPLASTY      Family History  Problem Relation Age of Onset  . Cancer Mother        cervical  . Diabetes Father   . Heart disease Father   .  Hypertension Father     Social History   Socioeconomic History  . Marital status: Married    Spouse name: Not on file  . Number of children: 3  . Years of education: Not on file  . Highest education level: Not on file  Social Needs  . Financial resource strain: Not on file  . Food insecurity - worry: Not on file  . Food insecurity - inability: Not on file  . Transportation needs - medical: Not on file  . Transportation needs - non-medical: Not on file  Occupational History  . Occupation: outside Press photographer    Comment: [employer]  Tobacco Use  . Smoking status: Former Research scientist (life sciences)  . Smokeless tobacco: Never Used  Substance and Sexual Activity  . Alcohol use: Yes    Alcohol/week: 0.0 oz  . Drug use: No  . Sexual activity: Not on file  Other Topics Concern  . Not on file  Social History Narrative  . Not on file   Review of Systems  Constitutional: Negative for fatigue and unexpected weight change.       Works out regularly Wears seat belt  HENT: Positive for hearing loss. Negative for tinnitus and trouble swallowing.        Has hearing aides--- uses intermittently Keeps up  with dentist  Eyes: Negative for visual disturbance.       No diplopia or unilateral vision loss  Respiratory: Negative for cough, chest tightness and shortness of breath.   Cardiovascular: Negative for chest pain, palpitations and leg swelling.  Gastrointestinal: Negative for blood in stool and constipation.       No heartburn  Endocrine: Negative for polydipsia and polyuria.  Genitourinary: Negative for difficulty urinating and urgency.       No sexual problems  Musculoskeletal: Negative for arthralgias, back pain and joint swelling.  Skin: Negative for rash.       No suspicious lesions---sees Dr Nehemiah Massed yearly  Allergic/Immunologic: Negative for environmental allergies and immunocompromised state.  Neurological: Negative for syncope, light-headedness and headaches.       Positional vertigo    Hematological: Negative for adenopathy. Does not bruise/bleed easily.  Psychiatric/Behavioral: Negative for dysphoric mood and sleep disturbance. The patient is not nervous/anxious.        Objective:   Physical Exam  Constitutional: He is oriented to person, place, and time. He appears well-developed and well-nourished. No distress.  HENT:  Head: Normocephalic and atraumatic.  Right Ear: External ear normal.  Left Ear: External ear normal.  Mouth/Throat: Oropharynx is clear and moist. No oropharyngeal exudate.  Eyes: Conjunctivae are normal. Pupils are equal, round, and reactive to light.  Neck: Normal range of motion. No thyromegaly present.  Cardiovascular: Normal rate, regular rhythm, normal heart sounds and intact distal pulses. Exam reveals no gallop.  No murmur heard. Pulmonary/Chest: Effort normal and breath sounds normal. No respiratory distress. He has no wheezes. He has no rales.  Abdominal: Soft. He exhibits no distension. There is no tenderness.  Musculoskeletal: He exhibits no edema or tenderness.  Lymphadenopathy:    He has no cervical adenopathy.  Neurological: He is alert and oriented to person, place, and time.  Skin: No rash noted. No erythema.  Multiple benign nevi  Psychiatric: He has a normal mood and affect. His behavior is normal.          Assessment & Plan:

## 2017-08-24 ENCOUNTER — Ambulatory Visit: Payer: 59 | Admitting: Internal Medicine

## 2017-08-24 ENCOUNTER — Encounter: Payer: Self-pay | Admitting: Internal Medicine

## 2017-08-24 VITALS — BP 132/80 | HR 101 | Temp 98.4°F | Wt 226.0 lb

## 2017-08-24 DIAGNOSIS — Z111 Encounter for screening for respiratory tuberculosis: Secondary | ICD-10-CM | POA: Diagnosis not present

## 2017-08-24 DIAGNOSIS — F39 Unspecified mood [affective] disorder: Secondary | ICD-10-CM

## 2017-08-24 MED ORDER — CITALOPRAM HYDROBROMIDE 20 MG PO TABS: 20.0000 mg | ORAL_TABLET | Freq: Every day | ORAL | 3 refills | Status: DC

## 2017-08-24 NOTE — Addendum Note (Signed)
Addended by: Pilar Grammes on: 08/24/2017 12:30 PM   Modules accepted: Orders

## 2017-08-24 NOTE — Assessment & Plan Note (Addendum)
Not MDD Recurrent dysthymia Wife mostly noticed and he agrees Will restart his citalopram --discussed potential side effects Recheck 1 month

## 2017-08-24 NOTE — Progress Notes (Signed)
   Subjective:    Patient ID: Logan Wise, male    DOB: 1960-12-24, 57 y.o.   MRN: [id number]  HPI Here due to depression symptoms  Wife has noticed it more than him Has been dropped into his depressed mood---and will spend a few days down, and finally improve Has days in "purgatory" where everything seems bad Never terrible--no missed work, no thoughts of death or suicide Spells happen every 1-2 weeks  Last on citalopram --but stopped 1.5 years ago First need for Rx was with divorce  Current Outpatient Medications on File Prior to Visit  Medication Sig Dispense Refill  . albuterol (VENTOLIN HFA) 108 (90 Base) MCG/ACT inhaler INHALE 2 PUFFS THREE TIMES A DAY AS NEEDED FOR ASTHMA PROBLEMS 36 g 2  . amphetamine-dextroamphetamine (ADDERALL) 20 MG tablet Take 1 tablet (20 mg total) by mouth daily. 30 tablet 0  . tadalafil (CIALIS) 5 MG tablet Take 1 tablet (5 mg total) by mouth daily. 30 tablet 11  . testosterone cypionate (DEPOTESTOTERONE CYPIONATE) 200 MG/ML injection Inject into the muscle every 14 (fourteen) days.       No current facility-administered medications on file prior to visit.     Allergies  Allergen Reactions  . Amoxicillin-Pot Clavulanate     Hives  . Peanut-Containing Drug Products     Past Medical History:  Diagnosis Date  . Allergy   . Anxiety   . Arthritis   . Asthma   . Depression   . GERD (gastroesophageal reflux disease)   . Hyperlipidemia   . Hypothyroid 2011  . Low testosterone     Past Surgical History:  Procedure Laterality Date  . ANKLE RECONSTRUCTION Left 05/2015  . HERNIA REPAIR  05/29/14  . KNEE ARTHROSCOPY     right  . NASAL STENOSIS REPAIR    . ROTATOR CUFF REPAIR     left 12/07, right 3/05 ([facility])  . SEPTOPLASTY      Family History  Problem Relation Age of Onset  . Cancer Mother        cervical  . Diabetes Father   . Heart disease Father   . Hypertension Father     Social History   Socioeconomic History  .  Marital status: Married    Spouse name: Not on file  . Number of children: 3  . Years of education: Not on file  . Highest education level: Not on file  Social Needs  . Financial resource strain: Not on file  . Food insecurity - worry: Not on file  . Food insecurity - inability: Not on file  . Transportation needs - medical: Not on file  . Transportation needs - non-medical: Not on file  Occupational History  . Occupation: outside Press photographer    Comment: [place]  Tobacco Use  . Smoking status: Former Research scientist (life sciences)  . Smokeless tobacco: Never Used  Substance and Sexual Activity  . Alcohol use: Yes    Alcohol/week: 0.0 oz  . Drug use: No  . Sexual activity: Not on file  Other Topics Concern  . Not on file  Social History Narrative  . Not on file   Review of Systems Eats more when depressed Sleeping about the same    Objective:   Physical Exam  Constitutional: He appears well-developed. No distress.  Psychiatric: He has a normal mood and affect. His behavior is normal.  Normal appearance and speech          Assessment & Plan:

## 2017-08-26 ENCOUNTER — Other Ambulatory Visit: Payer: Self-pay | Admitting: Internal Medicine

## 2017-08-26 LAB — TB SKIN TEST: TB Skin Test: NEGATIVE

## 2017-08-26 NOTE — Telephone Encounter (Signed)
Copied from [place]. Topic: Quick Communication - Rx Refill/Question >> Aug 26, 2017  9:37 AM Marin Olp L wrote: Medication: amphetamine-dextroamphetamine (ADDERALL) 20 MG tablet   Has the patient contacted their pharmacy? Yes.    (Agent: If no, request that the patient contact the pharmacy for the refill.)  Preferred Pharmacy (with phone number or street name): [place], [place] [facility] [place] [address] Phone: [phone] Fax: [phone] Not a 24 hour pharmacy; exact hours not known  Agent: Please be advised that RX refills may take up to 3 business days. We ask that you follow-up with your pharmacy.

## 2017-08-26 NOTE — Telephone Encounter (Signed)
LOV: 08/24/17  PCP: Dr. Silvio Pate  Pharmacy: [facility] [place]

## 2017-08-27 MED ORDER — AMPHETAMINE-DEXTROAMPHETAMINE 20 MG PO TABS: 20.0000 mg | ORAL_TABLET | Freq: Every day | ORAL | 0 refills | Status: DC

## 2017-09-07 ENCOUNTER — Ambulatory Visit: Payer: 59 | Admitting: Internal Medicine

## 2017-09-18 DIAGNOSIS — M1712 Unilateral primary osteoarthritis, left knee: Secondary | ICD-10-CM | POA: Diagnosis not present

## 2017-09-18 DIAGNOSIS — M659 Synovitis and tenosynovitis, unspecified: Secondary | ICD-10-CM | POA: Diagnosis not present

## 2017-09-22 ENCOUNTER — Encounter: Payer: Self-pay | Admitting: Internal Medicine

## 2017-09-22 ENCOUNTER — Ambulatory Visit: Payer: 59 | Admitting: Internal Medicine

## 2017-09-22 VITALS — BP 138/88 | HR 74 | Temp 98.0°F | Ht 71.0 in | Wt 223.0 lb

## 2017-09-22 DIAGNOSIS — F39 Unspecified mood [affective] disorder: Secondary | ICD-10-CM | POA: Diagnosis not present

## 2017-09-22 DIAGNOSIS — Z209 Contact with and (suspected) exposure to unspecified communicable disease: Secondary | ICD-10-CM

## 2017-09-22 NOTE — Progress Notes (Signed)
Subjective:    Patient ID: Logan Wise, male    DOB: [birth year], 57 y.o.   MRN: [id number]  HPI Here for follow up of mood problems  His mood is better Wife has noticed a big change He now notices that his anxiety is much better--mostly down  "I am hard on myself---naturally" Grew up under "the ultimate child"---felt pressure due to that He is comfortable with where he is  Current Outpatient Medications on File Prior to Visit  Medication Sig Dispense Refill  . albuterol (VENTOLIN HFA) 108 (90 Base) MCG/ACT inhaler INHALE 2 PUFFS THREE TIMES A DAY AS NEEDED FOR ASTHMA PROBLEMS 36 g 2  . amphetamine-dextroamphetamine (ADDERALL) 20 MG tablet Take 1 tablet (20 mg total) by mouth daily. 30 tablet 0  . citalopram (CELEXA) 20 MG tablet Take 1 tablet (20 mg total) by mouth daily. 90 tablet 3  . tadalafil (CIALIS) 5 MG tablet Take 1 tablet (5 mg total) by mouth daily. 30 tablet 11  . testosterone cypionate (DEPOTESTOTERONE CYPIONATE) 200 MG/ML injection Inject into the muscle every 14 (fourteen) days.       No current facility-administered medications on file prior to visit.     Allergies  Allergen Reactions  . Amoxicillin-Pot Clavulanate     Hives  . Peanut-Containing Drug Products     Past Medical History:  Diagnosis Date  . Allergy   . Anxiety   . Arthritis   . Asthma   . Depression   . GERD (gastroesophageal reflux disease)   . Hyperlipidemia   . Hypothyroid 2011  . Low testosterone     Past Surgical History:  Procedure Laterality Date  . ANKLE RECONSTRUCTION Left 05/2015  . HERNIA REPAIR  05/29/14  . KNEE ARTHROSCOPY     right  . NASAL STENOSIS REPAIR    . ROTATOR CUFF REPAIR     left 12/07, right 3/05 ([facility])  . SEPTOPLASTY      Family History  Problem Relation Age of Onset  . Cancer Mother        cervical  . Diabetes Father   . Heart disease Father   . Hypertension Father     Social History   Socioeconomic History  . Marital status:  Married    Spouse name: Not on file  . Number of children: 3  . Years of education: Not on file  . Highest education level: Not on file  Occupational History  . Occupation: outside Press photographer    Comment: [employer]  . Financial resource strain: Not on file  . Food insecurity:    Worry: Not on file    Inability: Not on file  . Transportation needs:    Medical: Not on file    Non-medical: Not on file  Tobacco Use  . Smoking status: Former Research scientist (life sciences)  . Smokeless tobacco: Never Used  Substance and Sexual Activity  . Alcohol use: Yes    Alcohol/week: 0.0 oz  . Drug use: No  . Sexual activity: Not on file  Lifestyle  . Physical activity:    Days per week: Not on file    Minutes per session: Not on file  . Stress: Not on file  Relationships  . Social connections:    Talks on phone: Not on file    Gets together: Not on file    Attends religious service: Not on file    Active member of club or organization: Not on file    Attends meetings of clubs  or organizations: Not on file    Relationship status: Not on file  . Intimate partner violence:    Fear of current or ex partner: Not on file    Emotionally abused: Not on file    Physically abused: Not on file    Forced sexual activity: Not on file  Other Topics Concern  . Not on file  Social History Narrative  . Not on file   Review of Systems  Sleeping "better than ever" Eating better--easier to say now to evening snacking Continues to work out---better since sleeping better Recent knee injection---saw ortho      Objective:   Physical Exam  Constitutional: He appears well-developed. No distress.  Psychiatric: He has a normal mood and affect. His behavior is normal.          Assessment & Plan:

## 2017-09-22 NOTE — Addendum Note (Signed)
Addended by: Lendon Collar on: 09/22/2017 10:59 AM   Modules accepted: Orders

## 2017-09-22 NOTE — Assessment & Plan Note (Signed)
Anxiety and dysthymia Much better back on the citalopram Discussed calling me if he seems to see a decreased effectiveness (might need higher dose)

## 2017-09-24 LAB — QUANTIFERON-TB GOLD PLUS
Mitogen-NIL: >10 IU/mL
NIL: 0.02 IU/mL
QuantiFERON-TB Gold Plus: NEGATIVE
TB1-NIL: 0 IU/mL
TB2-NIL: 0 IU/mL

## 2017-10-01 DIAGNOSIS — M170 Bilateral primary osteoarthritis of knee: Secondary | ICD-10-CM | POA: Diagnosis not present

## 2017-10-01 DIAGNOSIS — M25562 Pain in left knee: Secondary | ICD-10-CM | POA: Diagnosis not present

## 2017-10-01 DIAGNOSIS — M25561 Pain in right knee: Secondary | ICD-10-CM | POA: Diagnosis not present

## 2017-10-08 ENCOUNTER — Other Ambulatory Visit: Payer: Self-pay | Admitting: Internal Medicine

## 2017-10-08 MED ORDER — AMPHETAMINE-DEXTROAMPHETAMINE 20 MG PO TABS: 20.0000 mg | ORAL_TABLET | Freq: Every day | ORAL | 0 refills | Status: DC

## 2017-10-08 NOTE — Telephone Encounter (Signed)
Adderall refill request  LOV 09/22/17 with Dr. Loney Laurence [facility] [place], [place] [address].

## 2017-10-08 NOTE — Telephone Encounter (Signed)
Pt requesting refill Adderall Last refilled and qty; # 30 on 08/27/17 Last seen:annual exam 07/10/17; F/U visit 09/22/17 [facility]acy: [facility]

## 2017-10-08 NOTE — Telephone Encounter (Signed)
Copied from [place]. Topic: Quick Communication - See Telephone Encounter >> Oct 08, 2017 10:57 AM Ahmed Prima L wrote: CRM for notification. See Telephone encounter for: 10/08/17.  amphetamine-dextroamphetamine (ADDERALL) 20 MG tablet [place], [place]

## 2017-11-16 DIAGNOSIS — R42 Dizziness and giddiness: Secondary | ICD-10-CM | POA: Diagnosis not present

## 2017-11-16 DIAGNOSIS — S060X9A Concussion with loss of consciousness of unspecified duration, initial encounter: Secondary | ICD-10-CM | POA: Diagnosis not present

## 2017-11-18 ENCOUNTER — Other Ambulatory Visit: Payer: Self-pay

## 2017-11-18 DIAGNOSIS — M75112 Incomplete rotator cuff tear or rupture of left shoulder, not specified as traumatic: Secondary | ICD-10-CM | POA: Diagnosis not present

## 2017-11-18 DIAGNOSIS — M25512 Pain in left shoulder: Secondary | ICD-10-CM | POA: Diagnosis not present

## 2017-11-18 DIAGNOSIS — M19012 Primary osteoarthritis, left shoulder: Secondary | ICD-10-CM | POA: Diagnosis not present

## 2017-11-18 DIAGNOSIS — M19019 Primary osteoarthritis, unspecified shoulder: Secondary | ICD-10-CM | POA: Diagnosis not present

## 2017-11-18 NOTE — Telephone Encounter (Signed)
Last filled 10-08-17 #30 Last OV 09-22-17 No Future OV

## 2017-11-19 MED ORDER — AMPHETAMINE-DEXTROAMPHETAMINE 20 MG PO TABS: 20.0000 mg | ORAL_TABLET | Freq: Every day | ORAL | 0 refills | Status: DC

## 2017-11-24 DIAGNOSIS — M25512 Pain in left shoulder: Secondary | ICD-10-CM | POA: Diagnosis not present

## 2017-12-09 DIAGNOSIS — M25512 Pain in left shoulder: Secondary | ICD-10-CM | POA: Diagnosis not present

## 2017-12-24 DIAGNOSIS — H9319 Tinnitus, unspecified ear: Secondary | ICD-10-CM | POA: Diagnosis not present

## 2017-12-24 DIAGNOSIS — H903 Sensorineural hearing loss, bilateral: Secondary | ICD-10-CM | POA: Diagnosis not present

## 2017-12-25 DIAGNOSIS — M25512 Pain in left shoulder: Secondary | ICD-10-CM | POA: Diagnosis not present

## 2017-12-30 ENCOUNTER — Other Ambulatory Visit: Payer: Self-pay | Admitting: Internal Medicine

## 2018-01-07 DIAGNOSIS — M25512 Pain in left shoulder: Secondary | ICD-10-CM | POA: Diagnosis not present

## 2018-01-07 DIAGNOSIS — S30861A Insect bite (nonvenomous) of abdominal wall, initial encounter: Secondary | ICD-10-CM | POA: Diagnosis not present

## 2018-01-21 ENCOUNTER — Other Ambulatory Visit: Payer: Self-pay

## 2018-01-21 MED ORDER — AMPHETAMINE-DEXTROAMPHETAMINE 20 MG PO TABS: 20.0000 mg | ORAL_TABLET | Freq: Every day | ORAL | 0 refills | Status: DC

## 2018-01-21 NOTE — Telephone Encounter (Signed)
Last filled 11-19-17 #30 Last OV 09-22-17 No Future OV (Last OV stated f/u 1 year) [facility]  Last UDS 07/2016  Routing to [facility] in Dr Alla German absence

## 2018-01-27 DIAGNOSIS — D2339 Other benign neoplasm of skin of other parts of face: Secondary | ICD-10-CM | POA: Diagnosis not present

## 2018-01-27 DIAGNOSIS — L820 Inflamed seborrheic keratosis: Secondary | ICD-10-CM | POA: Diagnosis not present

## 2018-02-19 DIAGNOSIS — I10 Essential (primary) hypertension: Secondary | ICD-10-CM | POA: Diagnosis not present

## 2018-02-19 DIAGNOSIS — E7849 Other hyperlipidemia: Secondary | ICD-10-CM | POA: Diagnosis not present

## 2018-02-19 DIAGNOSIS — E034 Atrophy of thyroid (acquired): Secondary | ICD-10-CM | POA: Diagnosis not present

## 2018-03-03 DIAGNOSIS — E789 Disorder of lipoprotein metabolism, unspecified: Secondary | ICD-10-CM | POA: Diagnosis not present

## 2018-03-03 DIAGNOSIS — N529 Male erectile dysfunction, unspecified: Secondary | ICD-10-CM | POA: Diagnosis not present

## 2018-03-03 DIAGNOSIS — R42 Dizziness and giddiness: Secondary | ICD-10-CM | POA: Diagnosis not present

## 2018-03-11 ENCOUNTER — Other Ambulatory Visit: Payer: Self-pay | Admitting: Internal Medicine

## 2018-03-11 MED ORDER — AMPHETAMINE-DEXTROAMPHETAMINE 20 MG PO TABS: 20.0000 mg | ORAL_TABLET | Freq: Every day | ORAL | 0 refills | Status: DC

## 2018-03-11 NOTE — Telephone Encounter (Signed)
Copied from [place] [id number]. Topic: Quick Communication - See Telephone Encounter >> Mar 11, 2018  9:31 AM Ahmed Prima L wrote: CRM for notification. See Telephone encounter for: 03/11/18.  amphetamine-dextroamphetamine (ADDERALL) 20 MG tablet  [place], [place] [address]

## 2018-03-11 NOTE — Telephone Encounter (Signed)
Adderall refill Last Refill:  01/21/18 # 30, RF 0 Last OV: 09/22/17 PCP: Dr. Silvio Pate [facility]acy: [facility],                    [place], [place]  Sending to PCP for review/ refill auth.

## 2018-04-07 DIAGNOSIS — M170 Bilateral primary osteoarthritis of knee: Secondary | ICD-10-CM | POA: Diagnosis not present

## 2018-04-14 DIAGNOSIS — M170 Bilateral primary osteoarthritis of knee: Secondary | ICD-10-CM | POA: Diagnosis not present

## 2018-04-21 DIAGNOSIS — M170 Bilateral primary osteoarthritis of knee: Secondary | ICD-10-CM | POA: Diagnosis not present

## 2018-04-27 ENCOUNTER — Other Ambulatory Visit: Payer: Self-pay | Admitting: Internal Medicine

## 2018-04-27 MED ORDER — AMPHETAMINE-DEXTROAMPHETAMINE 20 MG PO TABS: 20.0000 mg | ORAL_TABLET | Freq: Every day | ORAL | 0 refills | Status: DC

## 2018-04-27 NOTE — Telephone Encounter (Signed)
Copied from [place] [phone]. Topic: Quick Communication - Rx Refill/Question >> Apr 27, 2018  8:49 AM Sheran Luz wrote: Medication: amphetamine-dextroamphetamine (ADDERALL) 20 MG tablet   Pt is requesting a refill of this medication.   Has the patient contacted their pharmacy? Yes-pt was advised to contact pharmacy  Preferred Pharmacy (with phone number or street name): [place], [place]

## 2018-04-27 NOTE — Telephone Encounter (Signed)
Rx request- for office review

## 2018-04-27 NOTE — Telephone Encounter (Signed)
Last filled 03-11-18 #30 Last OV 09-22-17 No Future OV

## 2018-04-27 NOTE — Addendum Note (Signed)
Addended by: Viviana Simpler I on: 04/27/2018 01:05 PM   Modules accepted: Orders

## 2018-04-27 NOTE — Addendum Note (Signed)
Addended by: Pilar Grammes on: 04/27/2018 11:32 AM   Modules accepted: Orders

## 2018-05-18 ENCOUNTER — Telehealth: Payer: Self-pay | Admitting: Internal Medicine

## 2018-05-18 DIAGNOSIS — R030 Elevated blood-pressure reading, without diagnosis of hypertension: Secondary | ICD-10-CM

## 2018-05-18 NOTE — Telephone Encounter (Signed)
Pt is requesting to be referred to a cardiologist because his bp has increased a few over the year and he wants a cardiac stress test for him taking adderall and echocardiogram done for his heart murmur.

## 2018-05-19 ENCOUNTER — Other Ambulatory Visit: Payer: Self-pay | Admitting: Internal Medicine

## 2018-05-19 DIAGNOSIS — R21 Rash and other nonspecific skin eruption: Secondary | ICD-10-CM | POA: Diagnosis not present

## 2018-05-19 DIAGNOSIS — L509 Urticaria, unspecified: Secondary | ICD-10-CM | POA: Diagnosis not present

## 2018-05-19 DIAGNOSIS — L853 Xerosis cutis: Secondary | ICD-10-CM | POA: Diagnosis not present

## 2018-06-09 ENCOUNTER — Telehealth: Payer: Self-pay | Admitting: Internal Medicine

## 2018-06-09 NOTE — Telephone Encounter (Signed)
Pt is requesting refill of adderall, sent to [facility] in [place]. Pt is out of meds. Pharmacy said can send in 3 mos dated for that month.

## 2018-06-10 MED ORDER — AMPHETAMINE-DEXTROAMPHETAMINE 20 MG PO TABS: 20.0000 mg | ORAL_TABLET | Freq: Every day | ORAL | 0 refills | Status: DC

## 2018-06-10 NOTE — Telephone Encounter (Signed)
I left a message on patient's voice mail to call back and schedule physical in April or later.

## 2018-06-10 NOTE — Telephone Encounter (Signed)
Last filled 04-27-18 #30 Last OV 09-22-17 No Future [place]

## 2018-06-10 NOTE — Telephone Encounter (Signed)
Have him set up PE for April or soon after

## 2018-06-14 ENCOUNTER — Ambulatory Visit: Payer: 59 | Admitting: Internal Medicine

## 2018-06-14 ENCOUNTER — Encounter: Payer: Self-pay | Admitting: Internal Medicine

## 2018-06-14 VITALS — BP 152/90 | HR 68 | Ht 71.0 in | Wt 232.6 lb

## 2018-06-14 DIAGNOSIS — I10 Essential (primary) hypertension: Secondary | ICD-10-CM | POA: Diagnosis not present

## 2018-06-14 DIAGNOSIS — E782 Mixed hyperlipidemia: Secondary | ICD-10-CM

## 2018-06-14 LAB — LIPID PANEL
Chol/HDL Ratio: 4.5 ratio (ref 0.0–5.0)
Cholesterol, Total: 218 mg/dL — ABNORMAL HIGH (ref 100–199)
HDL: 48 mg/dL (ref >39)
LDL Calculated: 123 mg/dL — ABNORMAL HIGH (ref 0–99)
Triglycerides: 235 mg/dL — ABNORMAL HIGH (ref 0–149)
VLDL Cholesterol Cal: 47 mg/dL — ABNORMAL HIGH (ref 5–40)

## 2018-06-14 MED ORDER — AMLODIPINE BESYLATE 5 MG PO TABS: 5.0000 mg | ORAL_TABLET | Freq: Every day | ORAL | 3 refills | Status: DC

## 2018-06-14 NOTE — Progress Notes (Signed)
Cardiology Office Note   Date:  06/14/2018   ID:  Logan Wise, DOB [birth year], MRN [id number]  PCP:  Venia Carbon, MD  Cardiologist:   Dorris Carnes, MD    Patient retuns to clinc for treatment of hypertension     History of Present Illness: Logan Wise is a 57 y.o. male with a history of HTN, iron overload (Treated with phlebotomy)   Echo in 2013 normal   He was seen by P [place] in the past   Last time seen Jan 2016  He was on amlodipine at that time    He is not sure why he is no longer on it   He is now rereferred for BP control  Breathing is good though he does have asthma    No CP   No palpitations   No edema Does have benign positional vertigo    He works out at gym 4 to 5 times per week     Current Meds  Medication Sig  . albuterol (PROVENTIL HFA;VENTOLIN HFA) 108 (90 Base) MCG/ACT inhaler INHALE TWO PUFFS BY MOUTH EVERY 8 HOURS AS NEEDED FOR ASTHMA  . amphetamine-dextroamphetamine (ADDERALL) 20 MG tablet Take 1 tablet (20 mg total) by mouth daily.  . citalopram (CELEXA) 20 MG tablet Take 1 tablet (20 mg total) by mouth daily.  . tadalafil (CIALIS) 5 MG tablet Take 5 mg by mouth daily as needed for erectile dysfunction.  Marland Kitchen testosterone cypionate (DEPOTESTOTERONE CYPIONATE) 200 MG/ML injection Inject into the muscle every 14 (fourteen) days.       Allergies:   Amoxicillin-pot clavulanate; Peanut oil; and Peanut-containing drug products   Past Medical History:  Diagnosis Date  . Allergy   . Anxiety   . Arthritis   . Asthma   . Depression   . GERD (gastroesophageal reflux disease)   . Hyperlipidemia   . Hypothyroid 2011  . Low testosterone     Past Surgical History:  Procedure Laterality Date  . ANKLE RECONSTRUCTION Left 05/2015  . HERNIA REPAIR  05/29/14  . KNEE ARTHROSCOPY     right  . NASAL STENOSIS REPAIR    . ROTATOR CUFF REPAIR     left 12/07, right 3/05 ([facility])  . SEPTOPLASTY       Social History:  The patient   reports that he has quit smoking. He has never used smokeless tobacco. He reports current alcohol use. He reports that he does not use drugs.   Family History:  The patient's family history includes Cancer in his mother; Diabetes in his father; Heart disease in his father; Hypertension in his father.    ROS:  Please see the history of present illness. All other systems are reviewed and  Negative to the above problem except as noted.    PHYSICAL EXAM: VS:  BP (!) 152/90   Pulse 68   Ht 5\' 11"  (1.803 m)   Wt 232 lb 9.6 oz (105.5 kg)   BMI 32.44 kg/m   GEN:  Obese 57 yo  in no acute distress  HEENT: normal  Neck: no JVD, carotid bruits, or masses Cardiac: RRR; no murmurs, rubs, or gallops,no edema  Respiratory:  clear to auscultation bilaterally, normal work of breathing GI: soft, nontender, nondistended, + BS  No hepatomegaly  MS: no deformity Moving all extremities   Skin: warm and dry, no rash Neuro:  Strength and sensation are intact Psych: euthymic mood, full affect   EKG:  EKG is ordered today.  SR 68 bpm      Lipid Panel    Component Value Date/Time   CHOL 197 12/08/2012 0753   TRIG 125.0 12/08/2012 0753   HDL 45.20 12/08/2012 0753   CHOLHDL 4 12/08/2012 0753   VLDL 25.0 12/08/2012 0753   LDLCALC 127 (H) 12/08/2012 0753      Wt Readings from Last 3 Encounters:  06/14/18 232 lb 9.6 oz (105.5 kg)  09/22/17 223 lb (101.2 kg)  08/24/17 226 lb (102.5 kg)      ASSESSMENT AND PLAN:  1  HTN   BP is elevated  Discussed pathphysiology with pt   He has a FHx of HTN   He has had in past    I am not convinced he has sleep apnea  Encouraged him to try snore lab app (sleeps on elevated head of bed on back)  Would start 2.5 mg amlodipine for a few days then 5 Get BP cuff  Bring in to next appt with log  F/U in 6 wks  2  Lipids   Will check fasting lipids today      Current medicines are reviewed at length with the patient today.  The patient does not have concerns  regarding medicines.  Signed, Dorris Carnes, MD  06/14/2018 10:46 AM    [place], [place], [place]  [address] Phone: [phone]; Fax: [phone]

## 2018-06-14 NOTE — Patient Instructions (Signed)
Medication Instructions:  Your physician has recommended you make the following change in your medication:  1.) start amlodipine 5 mg once a day for blood pressure.  For the first few days take 1/2 tablet daily, then increase to whole tablet.  If you need a refill on your cardiac medications before your next appointment, please call your pharmacy.   Lab work: Today: lipids If you have labs (blood work) drawn today and your tests are completely normal, you will receive your results only by: Marland Kitchen MyChart Message (if you have MyChart) OR . A paper copy in the mail If you have any lab test that is abnormal or we need to change your treatment, we will call you to review the results.  Testing/Procedures: none  Follow-Up: At [facility], you and your health needs are our priority.  As part of our continuing mission to provide you with exceptional heart care, we have created designated Provider Care Teams.  These Care Teams include your primary Cardiologist (physician) and Advanced Practice Providers (APPs -  Physician Assistants and Nurse Practitioners) who all work together to provide you with the care you need, when you need it. You will need a follow up appointment in:  6-8 weeks.  Please call our office 2 months in advance to schedule this appointment.  You may see Dorris Carnes, MD or one of the following Advanced Practice Providers on your designated Care Team: Richardson Dopp, PA-C [place], [place] . Daune Perch, NP  Any Other Special Instructions Will Be Listed Below (If Applicable).

## 2018-06-14 NOTE — Telephone Encounter (Signed)
Patient scheduled appointment on 10/15/18.

## 2018-06-15 ENCOUNTER — Telehealth: Payer: Self-pay | Admitting: Internal Medicine

## 2018-06-15 NOTE — Telephone Encounter (Signed)
Pt stated he need a prior auth for Cialis so he can get the 30 day supply instead of 20 day supply. Please advise pt

## 2018-06-25 ENCOUNTER — Other Ambulatory Visit: Payer: Self-pay | Admitting: Internal Medicine

## 2018-06-28 ENCOUNTER — Telehealth: Payer: Self-pay | Admitting: Internal Medicine

## 2018-06-28 NOTE — Telephone Encounter (Signed)
Looks like Dr Harrington Challenger, cardiologist did the last refill. Will forward to Dr Silvio Pate to approve.

## 2018-06-28 NOTE — Telephone Encounter (Signed)
I never received a request from CoverMyMeds or the pharmacy. I cannot start a PA without that.

## 2018-06-28 NOTE — Telephone Encounter (Signed)
This is a duplicate request

## 2018-06-28 NOTE — Telephone Encounter (Signed)
Pt need refill for Cialis    Sent to [facility]

## 2018-07-06 DIAGNOSIS — E669 Obesity, unspecified: Secondary | ICD-10-CM | POA: Diagnosis not present

## 2018-07-06 DIAGNOSIS — M170 Bilateral primary osteoarthritis of knee: Secondary | ICD-10-CM | POA: Diagnosis not present

## 2018-08-09 ENCOUNTER — Encounter: Payer: Self-pay | Admitting: Internal Medicine

## 2018-08-09 ENCOUNTER — Ambulatory Visit: Payer: 59 | Admitting: Internal Medicine

## 2018-08-09 VITALS — BP 140/74 | HR 83 | Ht 71.0 in | Wt 233.6 lb

## 2018-08-09 DIAGNOSIS — I10 Essential (primary) hypertension: Secondary | ICD-10-CM

## 2018-08-09 DIAGNOSIS — E782 Mixed hyperlipidemia: Secondary | ICD-10-CM

## 2018-08-09 MED ORDER — VALSARTAN-HYDROCHLOROTHIAZIDE 80-12.5 MG PO TABS: 1.0000 | ORAL_TABLET | Freq: Every day | ORAL | 11 refills | Status: DC

## 2018-08-09 NOTE — Patient Instructions (Signed)
Medication Instructions:  Your physician has recommended you make the following change in your medication:  1.) stop amlodipine 2.) start valsartan/hctz 80/12.5 mg - take one tablet once a day  If you need a refill on your cardiac medications before your next appointment, please call your pharmacy.   Lab work: In 10 days: BMET If you have labs (blood work) drawn today and your tests are completely normal, you will receive your results only by: Marland Kitchen MyChart Message (if you have MyChart) OR . A paper copy in the mail If you have any lab test that is abnormal or we need to change your treatment, we will call you to review the results.  Testing/Procedures: none  . Follow-Up:  Your physician recommends that you schedule a follow-up appointment in: about 6 weeks in the Hypertension Clinic with a PharmD   Any Other Special Instructions Will Be Listed Below (If Applicable).

## 2018-08-09 NOTE — Progress Notes (Signed)
Cardiology Office Note   Date:  08/09/2018   ID:  ROBERTSON COLCLOUGH, DOB [birth year], MRN [id number]  PCP:  Venia Carbon, MD  Cardiologist:   Dorris Carnes, MD   F/U of HTN      History of Present Illness: Logan Wise is a 58 y.o. male with a history of HTN, iron overload (Treated with phlebotomy)   Echo in 2013 normal   He was seen by P [place] in the past   Last time seen Jan 2016  He was on amlodipine at that time    Stopped at some point   I saw him for the first time in Dec 2019   P 152/90  Restarted amlodipien 5 mg     Since seen he says he has been extremely tired since starting amlodipine     No CP    Lpids  LDL 124   Trig 235    Current Meds  Medication Sig  . albuterol (PROVENTIL HFA;VENTOLIN HFA) 108 (90 Base) MCG/ACT inhaler INHALE TWO PUFFS BY MOUTH EVERY 8 HOURS AS NEEDED FOR ASTHMA  . amLODipine (NORVASC) 5 MG tablet Take 1 tablet (5 mg total) by mouth daily.  . citalopram (CELEXA) 20 MG tablet Take 10 mg by mouth daily.  . tadalafil (CIALIS) 5 MG tablet TAKE ONE TABLET BY MOUTH DAILY  . testosterone cypionate (DEPOTESTOTERONE CYPIONATE) 200 MG/ML injection Inject into the muscle every 14 (fourteen) days.       Allergies:   Amoxicillin-pot clavulanate; Peanut oil; and Peanut-containing drug products   Past Medical History:  Diagnosis Date  . Allergy   . Anxiety   . Arthritis   . Asthma   . Depression   . GERD (gastroesophageal reflux disease)   . Hyperlipidemia   . Hypothyroid 2011  . Low testosterone     Past Surgical History:  Procedure Laterality Date  . ANKLE RECONSTRUCTION Left 05/2015  . HERNIA REPAIR  05/29/14  . KNEE ARTHROSCOPY     right  . NASAL STENOSIS REPAIR    . ROTATOR CUFF REPAIR     left 12/07, right 3/05 ([facility])  . SEPTOPLASTY       Social History:  The patient  reports that he has quit smoking. He has never used smokeless tobacco. He reports current alcohol use. He reports that he does not use drugs.    Family History:  The patient's family history includes Cancer in his mother; Diabetes in his father; Heart disease in his father; Hypertension in his father.    ROS:  Please see the history of present illness. All other systems are reviewed and  Negative to the above problem except as noted.    PHYSICAL EXAM: VS:  BP 140/74   Pulse 83   Ht 5\' 11"  (1.803 m)   Wt 233 lb 9.6 oz (106 kg)   SpO2 94%   BMI 32.58 kg/m   GEN:  Obese 58 yo  in no acute distress  HEENT: normal  Neck: no JVD, carotid bruits, or masses Cardiac: RRR; no murmurs, rubs, or gallops,no edema  Respiratory:  clear to auscultation bilaterally, normal work of breathing GI: soft, nontender, nondistended, + BS  No hepatomegaly  MS: no deformity Moving all extremities   Skin: warm and dry, no rash Neuro:  Strength and sensation are intact Psych: euthymic mood, full affect   EKG:  EKG is not ordered today.    Lipid Panel    Component Value Date/Time   CHOL  218 (H) 06/14/2018 1118   TRIG 235 (H) 06/14/2018 1118   HDL 48 06/14/2018 1118   CHOLHDL 4.5 06/14/2018 1118   CHOLHDL 4 12/08/2012 0753   VLDL 25.0 12/08/2012 0753   LDLCALC 123 (H) 06/14/2018 1118      Wt Readings from Last 3 Encounters:  08/09/18 233 lb 9.6 oz (106 kg)  06/14/18 232 lb 9.6 oz (105.5 kg)  09/22/17 223 lb (101.2 kg)      ASSESSMENT AND PLAN:  1  HTN   Pt complains of fatigue   I am not convinced it is due to amlodpine but will switch to valsartan/HCTZ   80/12.5   Check BMET in 10 days    F/U in  6 wks    Bring BP cuft when returns  2   ? Sleep apnea    Pt dniese signif snoring   Will follow   Showed him sleep app      3  Lipids   Reviewed recent panel   Discussed diet   He admits to eating poorly over the past year (lots of pimento cheese)   Will follow in future   4   Obesity   Reviewed wt   Recomm incresed activity and watching diet   Current medicines are reviewed at length with the patient today.  The patient does not  have concerns regarding medicines.  Signed, Dorris Carnes, MD  08/09/2018 10:22 AM    [facility] [place], [place], [place]  [address] Phone: [phone]; Fax: [phone]

## 2018-08-24 ENCOUNTER — Other Ambulatory Visit: Payer: 59

## 2018-09-02 DIAGNOSIS — G4733 Obstructive sleep apnea (adult) (pediatric): Secondary | ICD-10-CM | POA: Diagnosis not present

## 2018-09-21 ENCOUNTER — Ambulatory Visit: Payer: 59

## 2018-10-15 ENCOUNTER — Telehealth: Payer: Self-pay | Admitting: Pharmacist

## 2018-10-15 ENCOUNTER — Encounter: Payer: 59 | Admitting: Internal Medicine

## 2018-10-15 NOTE — Telephone Encounter (Signed)
LMOM to r/s HTN visit via telehealth.

## 2018-10-18 ENCOUNTER — Encounter: Payer: Self-pay | Admitting: Internal Medicine

## 2018-10-18 ENCOUNTER — Telehealth: Payer: Self-pay | Admitting: *Deleted

## 2018-10-18 ENCOUNTER — Other Ambulatory Visit: Payer: Self-pay

## 2018-10-18 ENCOUNTER — Ambulatory Visit (INDEPENDENT_AMBULATORY_CARE_PROVIDER_SITE_OTHER): Payer: 59 | Admitting: Internal Medicine

## 2018-10-18 VITALS — Resp 12

## 2018-10-18 DIAGNOSIS — F39 Unspecified mood [affective] disorder: Secondary | ICD-10-CM

## 2018-10-18 DIAGNOSIS — F900 Attention-deficit hyperactivity disorder, predominantly inattentive type: Secondary | ICD-10-CM

## 2018-10-18 DIAGNOSIS — I10 Essential (primary) hypertension: Secondary | ICD-10-CM | POA: Insufficient documentation

## 2018-10-18 MED ORDER — AMPHETAMINE-DEXTROAMPHETAMINE 20 MG PO TABS: 20.0000 mg | ORAL_TABLET | Freq: Every day | ORAL | 0 refills | Status: DC

## 2018-10-18 NOTE — Assessment & Plan Note (Signed)
He tried off the adderall but found he was less effective at work Refill sent today

## 2018-10-18 NOTE — Assessment & Plan Note (Signed)
BP Readings from Last 3 Encounters:  08/09/18 140/74  06/14/18 (!) 152/90  09/22/17 138/88   On med per Dr Harrington Challenger Will check labs at his next in person visit

## 2018-10-18 NOTE — Progress Notes (Signed)
Subjective:    Patient ID: Logan Wise, male    DOB: 08-16-1960, 58 y.o.   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 albuterol (PROVENTIL HFA;VENTOLIN HFA) 108 (90 Base) MCG/ACT inhaler INHALE TWO PUFFS BY MOUTH EVERY 8 HOURS AS NEEDED FOR ASTHMA 17 each 1  . amphetamine-dextroamphetamine (ADDERALL) 20 MG tablet Take 1 tablet (20 mg total) by mouth daily. 30 tablet 0  . citalopram (CELEXA) 20 MG tablet Take 10 mg by mouth daily.    . tadalafil (CIALIS) 5 MG tablet TAKE ONE TABLET BY MOUTH DAILY 20 tablet 11  . testosterone cypionate (DEPOTESTOTERONE CYPIONATE) 200 MG/ML injection Inject into the muscle every 14 (fourteen) days.      . valsartan-hydrochlorothiazide (DIOVAN HCT) 80-12.5 MG tablet Take 1 tablet by mouth daily. 30 tablet 11   No current facility-administered medications on file prior to visit.     Allergies  Allergen Reactions  . Amoxicillin-Pot Clavulanate Hives    Hives Other reaction(s): ANAPHYLAXIS Hives Hives Other reaction(s): ANAPHYLAXIS Hives  Other reaction(s): ANAPHYLAXIS Hives Hives  Hives Other reaction(s): ANAPHYLAXIS Hives Hives Other reaction(s): ANAPHYLAXIS Hives  . Peanut Oil Shortness Of Breath and  Swelling  . Peanut-Containing Drug Products Swelling    Past Medical History:  Diagnosis Date  . Allergy   . Anxiety   . Arthritis   . Asthma   . Depression   . GERD (gastroesophageal reflux disease)   . Hyperlipidemia   . Hypothyroid 2011  . Low testosterone     Past Surgical History:  Procedure Laterality Date  . ANKLE RECONSTRUCTION Left 05/2015  . HERNIA REPAIR  05/29/14  . KNEE ARTHROSCOPY     right  . NASAL STENOSIS REPAIR    . ROTATOR CUFF REPAIR     left 12/07, right 3/05 ([facility])  . SEPTOPLASTY      Family History  Problem Relation Age of Onset  . Cancer Mother        cervical  . Diabetes Father   . Heart disease Father   . Hypertension Father     Social History   Socioeconomic History  . Marital status: Married    Spouse name: Not on file  . Number of children: 3  . Years of education: Not on file  . Highest education level: Not on file  Occupational History  . Occupation: outside Press photographer    Comment: [employer]  . Financial resource strain: Not on file  . Food insecurity:    Worry: Not on file    Inability: Not on file  . Transportation needs:    Medical: Not on file    Non-medical: Not on file  Tobacco Use  . Smoking status: Former  Smoker  . Smokeless tobacco: Never Used  Substance and Sexual Activity  . Alcohol use: Yes    Alcohol/week: 0.0 standard drinks  . Drug use: No  . Sexual activity: Not on file  Lifestyle  . Physical activity:    Days per week: Not on file    Minutes per session: Not on file  . Stress: Not on file  Relationships  . Social connections:    Talks on phone: Not on file    Gets together: Not on file    Attends religious service: Not on file    Active member of club or organization: Not on file    Attends meetings of clubs or organizations: Not on file    Relationship status: Not on file  . Intimate partner violence:    Fear of current or ex partner: Not on file    Emotionally abused: Not on  file    Physically abused: Not on file    Forced sexual activity: Not on file  Other Topics Concern  . Not on file  Social History Narrative  . Not on file   Review of Systems No sleep problems Appetite is good He did lose some weight over [employer] in exercise--can't run. Does walk regularly    Objective:   Physical Exam  Constitutional: He appears well-developed. No distress.  Respiratory: Effort normal. No respiratory distress.  Psychiatric: He has a normal mood and affect. His behavior is normal.           Assessment & Plan:

## 2018-10-18 NOTE — Assessment & Plan Note (Signed)
Mood continues to be good on the low dose citalopram He is comfortable with continuing this He does occasionally take a full tab (20mg ) but usually only 1/2

## 2018-10-18 NOTE — Telephone Encounter (Signed)
Please let him know that I sent the Rx to [facility]. I will not be able to do his PE till the restrictions ease up, but please set up a virtual visit within the next week to review his mood and the need for the stimulant

## 2018-10-18 NOTE — Telephone Encounter (Signed)
Patient called stating that his pharmacy told him that they requested a refill on his Adderall about 10 days ago and has not heard anything back. Patient stated that he has been off the medication for several months and wants to get back on it. Patient stated that he was scheduled for a physical in April, but it had to be cancelled because of Covid-19. Patient stated that he was told by the office that we will be in touch with him to reschedule the appointment. Medication is no longer on the med list. Last office visit 3/11/9. [facility]

## 2018-10-18 NOTE — Telephone Encounter (Signed)
Spoke to pt. Set up VV for this afternoon.

## 2018-10-22 NOTE — Telephone Encounter (Signed)
Left voicemail to set up phone visit for HTN clinic

## 2018-10-25 NOTE — Telephone Encounter (Signed)
3rd attempt made to reach pt to schedule phone visit for HTN.

## 2018-10-26 ENCOUNTER — Telehealth: Payer: Self-pay | Admitting: Pharmacist

## 2018-10-26 DIAGNOSIS — I10 Essential (primary) hypertension: Secondary | ICD-10-CM

## 2018-10-26 NOTE — Telephone Encounter (Signed)
Patient returned phone call about HTN visit. Patient needs BMP due to starting valsartan/HCTZ. He does not have a blood pressure cuff but will work on getting one today. Recommended OMRON brand. Patient would like to get labs done at [facility] in [place]. Will send order. Patient will check his BP once to twice a day. Proper technique was reviewed. I will call him next week Thursday or Friday to review.

## 2018-10-28 DIAGNOSIS — I10 Essential (primary) hypertension: Secondary | ICD-10-CM | POA: Diagnosis not present

## 2018-10-29 LAB — BASIC METABOLIC PANEL
BUN/Creatinine Ratio: 20 (ref 9–20)
BUN: 19 mg/dL (ref 6–24)
CO2: 25 mmol/L (ref 20–29)
Calcium: 9.6 mg/dL (ref 8.7–10.2)
Chloride: 100 mmol/L (ref 96–106)
Creatinine, Ser: 0.96 mg/dL (ref 0.76–1.27)
GFR calc Af Amer: 101 mL/min/{1.73_m2} (ref >59)
GFR calc non Af Amer: 87 mL/min/{1.73_m2} (ref >59)
Glucose: 95 mg/dL (ref 65–99)
Potassium: 4.4 mmol/L (ref 3.5–5.2)
Sodium: 141 mmol/L (ref 134–144)

## 2018-11-05 ENCOUNTER — Telehealth: Payer: Self-pay | Admitting: Pharmacist

## 2018-11-05 DIAGNOSIS — I10 Essential (primary) hypertension: Secondary | ICD-10-CM

## 2018-11-05 MED ORDER — VALSARTAN-HYDROCHLOROTHIAZIDE 160-25 MG PO TABS: 1.0000 | ORAL_TABLET | Freq: Every day | ORAL | 11 refills | Status: DC

## 2018-11-05 NOTE — Telephone Encounter (Signed)
Pt returned call to clinic and reports BP readings have been: 143/74, 139/73, 135/76, 141/79. He reports feeling well overall, occasionally notices some facial flushing. BMET stable since switching from amlodipine to valsartan-HCTZ 80-12.5mg  daily.  He inquires what he can do to help lower his BP. Discussed increasing activity to 30 minutes 5 days a week, decreasing sodium to < 2g daily, and limiting caffeine. He drinks 2 large cups of coffee a day, no other caffeine. He does use ibuprofen a few times a week, reports tylenol does not work. Advised him to use lowest effective dose for shortest duration. Also discussed that his Adderall can increase his BP.  Will increase valsartan-HCTZ to 160-25mg  daily. Pt aware to monitor and record BP readings. Will call pt in 3 weeks to discuss BP and will remind pt at that time to go to [facility] in [place] for f/u BMET.

## 2018-11-05 NOTE — Addendum Note (Signed)
Addended by: SUPPLE, MEGAN E on: 11/05/2018 01:20 PM   Modules accepted: Orders

## 2018-11-05 NOTE — Telephone Encounter (Signed)
Called patient to complete telehealth visit for HTN. Left voicemail for patient to return call. He was swtiched from amlodipine to valsartan/HCTZ at his last visit with Dr. Harrington Challenger on 2/10. Repeat BMP WNL. Patient recently acquired a BP cuff. Following up to see if valsartan/HCTZ needs to be titrated.

## 2018-11-10 DIAGNOSIS — M170 Bilateral primary osteoarthritis of knee: Secondary | ICD-10-CM | POA: Diagnosis not present

## 2018-11-17 DIAGNOSIS — M170 Bilateral primary osteoarthritis of knee: Secondary | ICD-10-CM | POA: Diagnosis not present

## 2018-11-23 ENCOUNTER — Telehealth: Payer: Self-pay | Admitting: Pharmacist

## 2018-11-23 NOTE — Telephone Encounter (Signed)
LMOM for pt to discuss BP since increasing valsartan-HCTZ to 160-25mg  daily and working on lifestyle improvements. Will also remind pt to go to [facility] in [place] for [employer].

## 2018-11-24 NOTE — Telephone Encounter (Signed)
LMOM again. 

## 2018-11-24 NOTE — Telephone Encounter (Signed)
Spoke with pt regarding BP. He reports feeling well on dose increase of valsartan-HCTZ, denies swelling or dizziness. Home BP readings include: 136/77, 138/67, 140/72, 130/68, 135/74. He has been staying active and has been watching the salt in his diet in particular.  He will have BMET drawn at [facility] in [place] in the next few days. If labs are stable, will plan to increase dose to valsartan-HCTZ 320-25mg  daily. Pt has about 1 week left of current dose.

## 2018-11-29 ENCOUNTER — Telehealth: Payer: Self-pay | Admitting: Pharmacist

## 2018-11-29 DIAGNOSIS — I10 Essential (primary) hypertension: Secondary | ICD-10-CM

## 2018-11-29 NOTE — Telephone Encounter (Signed)
LM to follow up as do not see that lab has been drawn.

## 2018-11-30 NOTE — Telephone Encounter (Signed)
Spoke with pt, he states they could not find lab order when he went to [facility] order again and he will go in the next few days for labs.

## 2018-12-02 ENCOUNTER — Telehealth: Payer: Self-pay | Admitting: Internal Medicine

## 2018-12-02 ENCOUNTER — Other Ambulatory Visit: Payer: Self-pay | Admitting: Internal Medicine

## 2018-12-02 NOTE — Telephone Encounter (Signed)
bEst number   [phone]  Pt call needing a refill on Albuterol inhailer  [facility] in [place] They told him to call office   Pt is out

## 2018-12-02 NOTE — Telephone Encounter (Signed)
I already filled it at the request of the phamacy

## 2018-12-07 ENCOUNTER — Telehealth: Payer: Self-pay | Admitting: Internal Medicine

## 2018-12-07 NOTE — Telephone Encounter (Signed)
New Message   Patient calling would like for Megan Supple to give him a call back about [facility] not having none of his orders yet.  States he been there three times and they still don't have them.

## 2018-12-07 NOTE — Telephone Encounter (Signed)
Orders have been placed twice - [facility] should be able to access them. Will also print and fax lab order and send to [place] pt back and left a message to see what [facility] he is trying to get labs drawn at.

## 2018-12-07 NOTE — Telephone Encounter (Addendum)
[facility] phone [phone] fax [phone] - [facility], supervisor Mechele Claude. Faxed over a copy of BMET order and confirmed with [facility] that they received it. They could now see the order that was entered 1 week ago but stated they could not see it this morning. They did not have an explanation for the delay. Pt is aware he can go to [facility] this afternoon.

## 2018-12-08 LAB — BASIC METABOLIC PANEL
BUN/Creatinine Ratio: 17 (ref 9–20)
BUN: 19 mg/dL (ref 6–24)
CO2: 23 mmol/L (ref 20–29)
Calcium: 9.1 mg/dL (ref 8.7–10.2)
Chloride: 93 mmol/L — ABNORMAL LOW (ref 96–106)
Creatinine, Ser: 1.09 mg/dL (ref 0.76–1.27)
GFR calc Af Amer: 87 mL/min/{1.73_m2} (ref >59)
GFR calc non Af Amer: 75 mL/min/{1.73_m2} (ref >59)
Glucose: 120 mg/dL — ABNORMAL HIGH (ref 65–99)
Potassium: 3.6 mmol/L (ref 3.5–5.2)
Sodium: 135 mmol/L (ref 134–144)

## 2018-12-08 MED ORDER — VALSARTAN-HYDROCHLOROTHIAZIDE 320-25 MG PO TABS: 1.0000 | ORAL_TABLET | Freq: Every day | ORAL | 3 refills | Status: DC

## 2018-12-08 NOTE — Telephone Encounter (Addendum)
Patient returned call-left message on our voicemail that it was ok to leave instructions on his VM.  Called patient back-no answer-left VM Advised patient that kidneys and electrolytes looked stable, will go ahead and increase dose of valsartan/HCTZ to 320/25mg  daily as discussed with Megan. Rx sent to [facility]. We will call him in 3-4 weeks to follow up with blood pressure and check labs one final time. Patient advise to continue to get BP and call us with any concerns.

## 2018-12-08 NOTE — Telephone Encounter (Signed)
Called patient and LVM to return call. Scr and K are stable on labwork from yesterday. Ok to increase valsartan/HCTZ to 320/25mg . Will send in new Rx.

## 2018-12-15 ENCOUNTER — Other Ambulatory Visit: Payer: Self-pay

## 2018-12-15 MED ORDER — AMPHETAMINE-DEXTROAMPHETAMINE 20 MG PO TABS: 20.0000 mg | ORAL_TABLET | Freq: Every day | ORAL | 0 refills | Status: DC

## 2018-12-15 NOTE — Telephone Encounter (Signed)
Name of Medication: Adderall 20 mg Name of Pharmacy: [facility] [place] Last Fill or Written Date and Quantity: # 30 on 10/18/18 Last Office Visit and Type: 10/18/18 FU ADHD Next Office Visit and Type:04/27/19 CPX  Last Controlled Substance Agreement Date: none Last FFM:[facility]  In v/m pt request more than 1 rx fo Adderall 20 mg so pt does not have to cb monthly.Please advise.

## 2019-01-06 ENCOUNTER — Telehealth: Payer: Self-pay | Admitting: Pharmacist

## 2019-01-06 ENCOUNTER — Other Ambulatory Visit: Payer: Self-pay | Admitting: Pharmacist

## 2019-01-06 DIAGNOSIS — I10 Essential (primary) hypertension: Secondary | ICD-10-CM

## 2019-01-06 NOTE — Addendum Note (Signed)
Addended by: Marcelle Overlie D on: 01/06/2019 03:32 PM   Modules accepted: Orders

## 2019-01-06 NOTE — Telephone Encounter (Signed)
Patient return call. States blood pressure has been running high 120's over low 70's. No need for follow up in clinic.  [facility] get [facility]. We will fax over orders

## 2019-01-06 NOTE — Telephone Encounter (Signed)
Called patient and LVM to schedule in office BP visit- will also need BMP

## 2019-01-11 LAB — BASIC METABOLIC PANEL
BUN/Creatinine Ratio: 15 (ref 9–20)
BUN: 15 mg/dL (ref 6–24)
CO2: 25 mmol/L (ref 20–29)
Calcium: 9.3 mg/dL (ref 8.7–10.2)
Chloride: 95 mmol/L — ABNORMAL LOW (ref 96–106)
Creatinine, Ser: 0.98 mg/dL (ref 0.76–1.27)
GFR calc Af Amer: 98 mL/min/{1.73_m2} (ref >59)
GFR calc non Af Amer: 85 mL/min/{1.73_m2} (ref >59)
Glucose: 122 mg/dL — ABNORMAL HIGH (ref 65–99)
Potassium: 3.7 mmol/L (ref 3.5–5.2)
Sodium: 136 mmol/L (ref 134–144)

## 2019-01-18 ENCOUNTER — Other Ambulatory Visit: Payer: Self-pay | Admitting: Internal Medicine

## 2019-01-18 NOTE — Telephone Encounter (Signed)
Patient called to get a refill on his Adderall.  Patient uses [facility].

## 2019-01-19 ENCOUNTER — Telehealth: Payer: Self-pay

## 2019-01-19 MED ORDER — AMPHETAMINE-DEXTROAMPHETAMINE 20 MG PO TABS: 20.0000 mg | ORAL_TABLET | Freq: Every day | ORAL | 0 refills | Status: DC

## 2019-01-19 NOTE — Telephone Encounter (Signed)
Pt said that his ins will pay for Cialis 20 mg qty # 6 every month. Pt request to change present Cialis dosage to Cialis 20 mg # 6 to [facility]. Pt last seen FU on 10/18/18.

## 2019-01-19 NOTE — Telephone Encounter (Signed)
Last filled 12-15-18 #30 Last OV 10-18-18 Next OV 04-27-19 Logan Wise

## 2019-01-20 MED ORDER — TADALAFIL 20 MG PO TABS: 20.0000 mg | ORAL_TABLET | Freq: Every day | ORAL | 11 refills | Status: DC | PRN

## 2019-01-20 NOTE — Telephone Encounter (Signed)
New rx sent to [facility]. Spoke to pt.

## 2019-01-20 NOTE — Telephone Encounter (Signed)
Okay to change to this with #11 refills

## 2019-02-04 ENCOUNTER — Other Ambulatory Visit: Payer: Self-pay | Admitting: Internal Medicine

## 2019-02-04 NOTE — Telephone Encounter (Signed)
Spoke to pt. He has been taking a whole 20mg  at the beginning of July. It does not seem to be helping. Not sure he wants to do 40mg  of citalopram.   He said he can do a virtual visit to discuss options if needed before making a change.

## 2019-02-04 NOTE — Telephone Encounter (Signed)
Will increase to 20mg  Will review at his upcoming appt in October

## 2019-02-04 NOTE — Telephone Encounter (Signed)
Patient called and states his depression symptoms and anxiety is getting worse since he was seen last in April. He would like to go up on the dose if possible. No suicidal thoughts.

## 2019-02-06 NOTE — Telephone Encounter (Signed)
Yes---go ahead and schedule virtual visit this week. Then October can be follow up from any changes we make now

## 2019-02-07 NOTE — Telephone Encounter (Signed)
I left pt a message on VM and sent him a detailed MyChart message. I placed him on the schedule for a virtual visit on Thursday, August 13 at 2:15pm. I asked that he call the office or send the MyChart message back letting us know if he can keep that appt time and what media form he will be using: Text or email.

## 2019-02-10 ENCOUNTER — Encounter: Payer: Self-pay | Admitting: Internal Medicine

## 2019-02-10 ENCOUNTER — Ambulatory Visit (INDEPENDENT_AMBULATORY_CARE_PROVIDER_SITE_OTHER): Payer: 59 | Admitting: Internal Medicine

## 2019-02-10 DIAGNOSIS — F39 Unspecified mood [affective] disorder: Secondary | ICD-10-CM | POA: Diagnosis not present

## 2019-02-10 NOTE — Assessment & Plan Note (Signed)
Mostly subsyndromal dysthymia Not clearly MDD even now--but clearly worse Did increase the citalopram back to 20 about a week ago Takes the adderall just for work  I asked him to take the adderall daily---may work as adjunct Will wait till end of month--if not better, but responds some to the increased citalopram, will increase to 30mg  and even consider 40 Other meds to consider would be bupropion or quetiapine (but would hold off for now)

## 2019-02-10 NOTE — Progress Notes (Signed)
Subjective:    Patient ID: Logan Wise, male    DOB: 24-Jan-1961, 58 y.o.   MRN: [id number]  HPI Virtual visit to review his worsening depression Identification done Reviewed billing and he gave consent I am in my office and he is home  Depression has been worsening Had decreased to 10mg  some time ago--but he did go back to the 20mg  about a week ago He may feel some better No thoughts of dying No helplessness or hopelessness  Still working full time now [employer] was restructured and he had to interview for his job again--but got it Now working from home  Current Outpatient Medications on File Prior to Visit  Medication Sig Dispense Refill  . albuterol (VENTOLIN HFA) 108 (90 Base) MCG/ACT inhaler INHALE TWO PUFFS BY MOUTH EVERY 8 HOURS AS NEEDED FOR ASTHMA 17 g 0  . amphetamine-dextroamphetamine (ADDERALL) 20 MG tablet Take 1 tablet (20 mg total) by mouth daily. 30 tablet 0  . citalopram (CELEXA) 20 MG tablet TAKE ONE TABLET BY MOUTH DAILY 90 tablet 0  . tadalafil (CIALIS) 20 MG tablet Take 1 tablet (20 mg total) by mouth daily as needed for erectile dysfunction. 6 tablet 11  . testosterone cypionate (DEPOTESTOTERONE CYPIONATE) 200 MG/ML injection Inject into the muscle every 14 (fourteen) days.      . valsartan-hydrochlorothiazide (DIOVAN-HCT) 320-25 MG tablet Take 1 tablet by mouth daily. 90 tablet 3   No current facility-administered medications on file prior to visit.     Allergies  Allergen Reactions  . Amoxicillin-Pot Clavulanate Hives    Hives Other reaction(s): ANAPHYLAXIS Hives Hives Other reaction(s): ANAPHYLAXIS Hives  Other reaction(s): ANAPHYLAXIS Hives Hives  Hives Other reaction(s): ANAPHYLAXIS Hives Hives Other reaction(s): ANAPHYLAXIS Hives  . Peanut Oil Shortness Of Breath and Swelling  . Peanut-Containing Drug Products Swelling    Past Medical History:  Diagnosis Date  . Allergy   . Anxiety   . Arthritis   . Asthma   .  Depression   . GERD (gastroesophageal reflux disease)   . Hyperlipidemia   . Hypothyroid 2011  . Low testosterone     Past Surgical History:  Procedure Laterality Date  . ANKLE RECONSTRUCTION Left 05/2015  . HERNIA REPAIR  05/29/14  . KNEE ARTHROSCOPY     right  . NASAL STENOSIS REPAIR    . ROTATOR CUFF REPAIR     left 12/07, right 3/05 ([facility])  . SEPTOPLASTY      Family History  Problem Relation Age of Onset  . Cancer Mother        cervical  . Diabetes Father   . Heart disease Father   . Hypertension Father     Social History   Socioeconomic History  . Marital status: Married    Spouse name: Not on file  . Number of children: 3  . Years of education: Not on file  . Highest education level: Not on file  Occupational History  . Occupation: outside Press photographer    Comment: [employer]  . Financial resource strain: Not on file  . Food insecurity    Worry: Not on file    Inability: Not on file  . Transportation needs    Medical: Not on file    Non-medical: Not on file  Tobacco Use  . Smoking status: Former Research scientist (life sciences)  . Smokeless tobacco: Never Used  Substance and Sexual Activity  . Alcohol use: Yes    Alcohol/week: 0.0 standard drinks  . Drug use: No  .  Sexual activity: Not on file  Lifestyle  . Physical activity    Days per week: Not on file    Minutes per session: Not on file  . Stress: Not on file  Relationships  . Social Herbalist on phone: Not on file    Gets together: Not on file    Attends religious service: Not on file    Active member of club or organization: Not on file    Attends meetings of clubs or organizations: Not on file    Relationship status: Not on file  . Intimate partner violence    Fear of current or ex partner: Not on file    Emotionally abused: Not on file    Physically abused: Not on file    Forced sexual activity: Not on file  Other Topics Concern  . Not on file  Social History Narrative  . Not on file    Review of Systems Sleeping not well in the past few days--has just restarted melatonin Appetite is fine    Objective:   Physical Exam  Constitutional: He appears well-developed. No distress.  Respiratory: Effort normal. No respiratory distress.  Psychiatric: He has a normal mood and affect. His behavior is normal.           Assessment & Plan:

## 2019-02-22 ENCOUNTER — Other Ambulatory Visit: Payer: Self-pay

## 2019-02-22 MED ORDER — AMPHETAMINE-DEXTROAMPHETAMINE 20 MG PO TABS: 20.0000 mg | ORAL_TABLET | Freq: Every day | ORAL | 0 refills | Status: DC

## 2019-02-22 NOTE — Telephone Encounter (Signed)
Name of Medication: Adderall 20 mg Name of [facility]acy: [facility] Last [place] or Written Date and Quantity:#30 on 01/19/19  Last Office Visit and Type:02/10/19 acute & 10/18/18 ADHD  Next Office Visit and Type:04/27/19 CPX  Last Controlled Substance Agreement Date: none seen Last UDS:08/14/2016  Pt left v/m requesting a 6 month refill on Adderall 20 mg.

## 2019-03-25 ENCOUNTER — Other Ambulatory Visit: Payer: Self-pay | Admitting: Internal Medicine

## 2019-03-25 MED ORDER — AMPHETAMINE-DEXTROAMPHETAMINE 20 MG PO TABS: 20.0000 mg | ORAL_TABLET | Freq: Every day | ORAL | 0 refills | Status: DC

## 2019-03-25 NOTE — Telephone Encounter (Signed)
Patient is requesting a refill  Adderall  2 tablets left  [place]

## 2019-03-25 NOTE — Telephone Encounter (Signed)
Name of Medication: Adderall 20 mg Name of Pharmacy: [facility] Last Fill or Written Date and Quantity: # 30 on 02/22/19 Last Office Visit and Type:02/10/19 FU depression & 10/18/18 ADHD Next Office Visit and Type: 04/27/19 CPX

## 2019-04-27 ENCOUNTER — Encounter: Payer: Self-pay | Admitting: Internal Medicine

## 2019-04-27 ENCOUNTER — Ambulatory Visit (INDEPENDENT_AMBULATORY_CARE_PROVIDER_SITE_OTHER): Payer: 59 | Admitting: Internal Medicine

## 2019-04-27 ENCOUNTER — Other Ambulatory Visit: Payer: Self-pay

## 2019-04-27 VITALS — BP 126/70 | HR 78 | Temp 98.5°F | Ht 71.0 in | Wt 227.0 lb

## 2019-04-27 DIAGNOSIS — I10 Essential (primary) hypertension: Secondary | ICD-10-CM

## 2019-04-27 DIAGNOSIS — Z23 Encounter for immunization: Secondary | ICD-10-CM | POA: Diagnosis not present

## 2019-04-27 DIAGNOSIS — Z0000 Encounter for general adult medical examination without abnormal findings: Secondary | ICD-10-CM

## 2019-04-27 DIAGNOSIS — E291 Testicular hypofunction: Secondary | ICD-10-CM

## 2019-04-27 DIAGNOSIS — F39 Unspecified mood [affective] disorder: Secondary | ICD-10-CM

## 2019-04-27 DIAGNOSIS — Z125 Encounter for screening for malignant neoplasm of prostate: Secondary | ICD-10-CM | POA: Diagnosis not present

## 2019-04-27 DIAGNOSIS — F900 Attention-deficit hyperactivity disorder, predominantly inattentive type: Secondary | ICD-10-CM | POA: Diagnosis not present

## 2019-04-27 LAB — CBC
HCT: 46.5 % (ref 39.0–52.0)
Hemoglobin: 16 g/dL (ref 13.0–17.0)
MCHC: 34.5 g/dL (ref 30.0–36.0)
MCV: 93.2 fl (ref 78.0–100.0)
Platelets: 192 10*3/uL (ref 150.0–400.0)
RBC: 4.98 Mil/uL (ref 4.22–5.81)
RDW: 12.4 % (ref 11.5–15.5)
WBC: 5.6 10*3/uL (ref 4.0–10.5)

## 2019-04-27 LAB — COMPREHENSIVE METABOLIC PANEL
ALT: 29 U/L (ref 0–53)
AST: 24 U/L (ref 0–37)
Albumin: 4.6 g/dL (ref 3.5–5.2)
Alkaline Phosphatase: 45 U/L (ref 39–117)
BUN: 16 mg/dL (ref 6–23)
CO2: 33 mEq/L — ABNORMAL HIGH (ref 19–32)
Calcium: 9.7 mg/dL (ref 8.4–10.5)
Chloride: 98 mEq/L (ref 96–112)
Creatinine, Ser: 1 mg/dL (ref 0.40–1.50)
GFR: 76.67 mL/min (ref >60.00)
Glucose, Bld: 87 mg/dL (ref 70–99)
Potassium: 4.2 mEq/L (ref 3.5–5.1)
Sodium: 138 mEq/L (ref 135–145)
Total Bilirubin: 1 mg/dL (ref 0.2–1.2)
Total Protein: 6.8 g/dL (ref 6.0–8.3)

## 2019-04-27 MED ORDER — ALBUTEROL SULFATE HFA 108 (90 BASE) MCG/ACT IN AERS: 2.0000 | INHALATION_SPRAY | Freq: Four times a day (QID) | RESPIRATORY_TRACT | 1 refills | Status: DC | PRN

## 2019-04-27 NOTE — Assessment & Plan Note (Signed)
Continues with the adderall (adjunct therapy for depression as well)

## 2019-04-27 NOTE — Assessment & Plan Note (Signed)
Healthy Exercises daily Colon due 2026 Will check PSA yearly Flu vaccine and shingrix now

## 2019-04-27 NOTE — Assessment & Plan Note (Signed)
BP Readings from Last 3 Encounters:  04/27/19 126/70  08/09/18 140/74  06/14/18 (!) 152/90   Good control

## 2019-04-27 NOTE — Assessment & Plan Note (Signed)
I will prescribe the testosterone from now on

## 2019-04-27 NOTE — Addendum Note (Signed)
Addended by: Pilar Grammes on: 04/27/2019 10:38 AM   Modules accepted: Orders

## 2019-04-27 NOTE — Assessment & Plan Note (Signed)
Depression controlled with dual therapy now

## 2019-04-27 NOTE — Progress Notes (Signed)
Subjective:    Patient ID: Logan Wise, male    DOB: 24-Dec-1960, 58 y.o.   MRN: [id number]  HPI Here for physical  Depression has settled down Citalopram still at 20 and using the adderall every day  Continues on the testosterone  Requests that I give it to him Takes 68ml weekly--didn't do well trying to space it out more Gets severe fatigue and ED if he delays more than a week Still uses the cialis (for sex)  Same job but all virtual now This is harder for him---he preferred to get out in person  Current Outpatient Medications on File Prior to Visit  Medication Sig Dispense Refill  . albuterol (VENTOLIN HFA) 108 (90 Base) MCG/ACT inhaler INHALE TWO PUFFS BY MOUTH EVERY 8 HOURS AS NEEDED FOR ASTHMA 17 g 0  . amphetamine-dextroamphetamine (ADDERALL) 20 MG tablet Take 1 tablet (20 mg total) by mouth daily. 30 tablet 0  . citalopram (CELEXA) 20 MG tablet TAKE ONE TABLET BY MOUTH DAILY 90 tablet 0  . tadalafil (CIALIS) 20 MG tablet Take 1 tablet (20 mg total) by mouth daily as needed for erectile dysfunction. 6 tablet 11  . testosterone cypionate (DEPOTESTOTERONE CYPIONATE) 200 MG/ML injection Inject into the muscle every 14 (fourteen) days.      . valsartan-hydrochlorothiazide (DIOVAN-HCT) 320-25 MG tablet Take 1 tablet by mouth daily. 90 tablet 3   No current facility-administered medications on file prior to visit.     Allergies  Allergen Reactions  . Amoxicillin-Pot Clavulanate Hives    Hives Other reaction(s): ANAPHYLAXIS Hives Hives Other reaction(s): ANAPHYLAXIS Hives  Other reaction(s): ANAPHYLAXIS Hives Hives  Hives Other reaction(s): ANAPHYLAXIS Hives Hives Other reaction(s): ANAPHYLAXIS Hives  . Peanut Oil Shortness Of Breath and Swelling  . Peanut-Containing Drug Products Swelling    Past Medical History:  Diagnosis Date  . Allergy   . Anxiety   . Arthritis   . Asthma   . Depression   . GERD (gastroesophageal reflux disease)   .  Hyperlipidemia   . Hypothyroid 2011  . Low testosterone     Past Surgical History:  Procedure Laterality Date  . ANKLE RECONSTRUCTION Left 05/2015  . HERNIA REPAIR  05/29/14  . KNEE ARTHROSCOPY     right  . NASAL STENOSIS REPAIR    . ROTATOR CUFF REPAIR     left 12/07, right 3/05 ([facility])  . SEPTOPLASTY      Family History  Problem Relation Age of Onset  . Cancer Mother        cervical  . Diabetes Father   . Heart disease Father   . Hypertension Father     Social History   Socioeconomic History  . Marital status: Married    Spouse name: Not on file  . Number of children: 3  . Years of education: Not on file  . Highest education level: Not on file  Occupational History  . Occupation: outside Press photographer    Comment: [employer]  . Financial resource strain: Not on file  . Food insecurity    Worry: Not on file    Inability: Not on file  . Transportation needs    Medical: Not on file    Non-medical: Not on file  Tobacco Use  . Smoking status: Former Research scientist (life sciences)  . Smokeless tobacco: Never Used  Substance and Sexual Activity  . Alcohol use: Yes    Alcohol/week: 0.0 standard drinks  . Drug use: No  . Sexual activity: Not on file  Lifestyle  . Physical activity    Days per week: Not on file    Minutes per session: Not on file  . Stress: Not on file  Relationships  . Social Herbalist on phone: Not on file    Gets together: Not on file    Attends religious service: Not on file    Active member of club or organization: Not on file    Attends meetings of clubs or organizations: Not on file    Relationship status: Not on file  . Intimate partner violence    Fear of current or ex partner: Not on file    Emotionally abused: Not on file    Physically abused: Not on file    Forced sexual activity: Not on file  Other Topics Concern  . Not on file  Social History Narrative  . Not on file   Review of Systems  Constitutional: Negative for fatigue  and unexpected weight change.       Continues to exercise Wears seat belt  HENT: Positive for tinnitus. Negative for dental problem, hearing loss and trouble swallowing.        Keeps up with dentist  Eyes: Negative for visual disturbance.       No diplopia or unilateral vision loss  Respiratory: Negative for cough and shortness of breath.        Uses inhaler in allergy season mostly  Cardiovascular: Negative for chest pain, palpitations and leg swelling.  Gastrointestinal: Negative for blood in stool and constipation.       Rare heartburn---uses nexium prn  Endocrine: Negative for polydipsia and polyuria.  Genitourinary: Negative for difficulty urinating and urgency.  Musculoskeletal: Positive for arthralgias. Negative for back pain and joint swelling.       Knee arthritis is ongoing issue--has had injections  Skin: Negative for rash.       No suspicious lesions Does see derm  Allergic/Immunologic: Positive for environmental allergies. Negative for immunocompromised state.  Neurological: Negative for syncope and headaches.       Daily mild vertigo---BPPV   Hematological: Negative for adenopathy. Does not bruise/bleed easily.  Psychiatric/Behavioral: Negative for sleep disturbance. The patient is not nervous/anxious.        Objective:   Physical Exam  Constitutional: He is oriented to person, place, and time. He appears well-developed. No distress.  HENT:  Head: Normocephalic and atraumatic.  Right Ear: External ear normal.  Left Ear: External ear normal.  Mouth/Throat: Oropharynx is clear and moist. No oropharyngeal exudate.  Eyes: Pupils are equal, round, and reactive to light. Conjunctivae are normal.  Neck: No thyromegaly present.  Cardiovascular: Normal rate, regular rhythm, normal heart sounds and intact distal pulses. Exam reveals no gallop.  No murmur heard. Respiratory: Effort normal and breath sounds normal. No respiratory distress. He has no wheezes. He has no rales.   GI: Soft. There is no abdominal tenderness.  Musculoskeletal:        General: No tenderness or edema.  Lymphadenopathy:    He has no cervical adenopathy.  Neurological: He is alert and oriented to person, place, and time.  Skin: No rash noted. No erythema.  Psychiatric: He has a normal mood and affect. His behavior is normal.           Assessment & Plan:

## 2019-04-28 LAB — PSA: PSA: 0.8 ng/mL (ref 0.10–4.00)

## 2019-05-06 ENCOUNTER — Other Ambulatory Visit: Payer: Self-pay

## 2019-05-06 MED ORDER — AMPHETAMINE-DEXTROAMPHETAMINE 20 MG PO TABS: 20.0000 mg | ORAL_TABLET | Freq: Every day | ORAL | 0 refills | Status: DC

## 2019-05-06 NOTE — Telephone Encounter (Signed)
Name of Medication: Adderall Name of [facility]: [facility] Last Fill or Written Date and Quantity: 03/25/2019 #30, 0 refill Last Office Visit and Type: 04/27/2019 CPE Next Office Visit and Type: 10/26/2019 F/U Last Controlled Substance Agreement Date: none Last UDS: 08/14/2016  Patient apologized for the short notice but he does need this medication refilled today. Thank you

## 2019-05-19 ENCOUNTER — Other Ambulatory Visit: Payer: Self-pay | Admitting: Internal Medicine

## 2019-06-06 ENCOUNTER — Other Ambulatory Visit: Payer: Self-pay | Admitting: *Deleted

## 2019-06-06 MED ORDER — AMPHETAMINE-DEXTROAMPHETAMINE 20 MG PO TABS: 20.0000 mg | ORAL_TABLET | Freq: Every day | ORAL | 0 refills | Status: DC

## 2019-06-06 NOTE — Telephone Encounter (Signed)
Patient left a voicemail requesting a refill on Adderall. Name of Medication: Adderall Name of [facility]: [facility] Last Fill or Written Date and Quantity: 05/05/19 #30 Last Office Visit and Type: 04/27/19 Next Office Visit and Type: 10/26/18 Last Controlled Substance Agreement Date: none  Last UDS:08/14/16

## 2019-06-28 ENCOUNTER — Other Ambulatory Visit: Payer: Self-pay

## 2019-06-28 MED ORDER — ALBUTEROL SULFATE HFA 108 (90 BASE) MCG/ACT IN AERS: 2.0000 | INHALATION_SPRAY | Freq: Four times a day (QID) | RESPIRATORY_TRACT | 1 refills | Status: DC | PRN

## 2019-06-28 NOTE — Telephone Encounter (Signed)
Pt left v/m requesting refill albuterol inhaler to [facility]. Pt had annual on 04/27/19. Pt is out of inhaler. Refilled per protocol to [facility]. Left v/m per DPR that refill had been sent to [facility] and v/m that if needs appt to call [facility] for SOB, cough or wheezing; UC & ED precautions given also.

## 2019-07-08 ENCOUNTER — Other Ambulatory Visit: Payer: Self-pay

## 2019-07-08 MED ORDER — AMPHETAMINE-DEXTROAMPHETAMINE 20 MG PO TABS: 20.0000 mg | ORAL_TABLET | Freq: Every day | ORAL | 0 refills | Status: DC

## 2019-07-08 NOTE — Telephone Encounter (Signed)
Name of [place] 20 mg  Name of Pharmacy: [facility] or Written Date and Quantity:# 30 on 06/06/19  Last Office Visit and Type: 04/27/19 annual Next Office Visit and Type: 10/26/19 for 6 mth FU  Pt will be out of med on 07/11/19.

## 2019-08-08 ENCOUNTER — Telehealth: Payer: Self-pay | Admitting: Internal Medicine

## 2019-08-08 NOTE — Telephone Encounter (Signed)
Please let him know his sugar (glucose) was normal back in October---so there is really no reason for [place] to check an A1c

## 2019-08-08 NOTE — Telephone Encounter (Signed)
Left detailed message on VM per DPR. 

## 2019-08-08 NOTE — Telephone Encounter (Signed)
Pt called to schedule a lab appointment to check a1c. He said it has been a long time since he has had it checked. He didn't want to schedule an office visit, just labs. Is this okay to schedule as a lab only appointment or does he need an office visit?

## 2019-08-15 ENCOUNTER — Other Ambulatory Visit: Payer: Self-pay

## 2019-08-15 MED ORDER — AMPHETAMINE-DEXTROAMPHETAMINE 20 MG PO TABS: 20.0000 mg | ORAL_TABLET | Freq: Every day | ORAL | 0 refills | Status: DC

## 2019-08-15 NOTE — Telephone Encounter (Signed)
Name of Medication: Adderall 20 mg Name of Pharmacy: [facility] or Written Date and Quantity: #30 on 07/08/19 Last Office Visit and Type: 04/27/2019 annual Next Office Visit and Type: 10/26/2019 for 6 mth FU

## 2019-09-19 ENCOUNTER — Other Ambulatory Visit: Payer: Self-pay

## 2019-09-19 MED ORDER — AMPHETAMINE-DEXTROAMPHETAMINE 20 MG PO TABS: 20.0000 mg | ORAL_TABLET | Freq: Every day | ORAL | 0 refills | Status: DC

## 2019-09-19 NOTE — Telephone Encounter (Signed)
Name of Medication:Adderall 20 mg  Name of [place] or Written Date and Quantity:# 30 on 08/15/19  Last Office Visit and Type:04/27/19 for annual exam  Next Office Visit and Type:10/26/19 for 6 mth FU   Pt left v/m that he has one day of medication left.

## 2019-10-25 ENCOUNTER — Other Ambulatory Visit: Payer: Self-pay | Admitting: *Deleted

## 2019-10-25 MED ORDER — AMPHETAMINE-DEXTROAMPHETAMINE 20 MG PO TABS: 20.0000 mg | ORAL_TABLET | Freq: Every day | ORAL | 0 refills | Status: DC

## 2019-10-25 NOTE — Telephone Encounter (Signed)
Patient left a voicemail stating that he needs a refill on his Adderall  Name of Medication:  Name of Pharmacy:  Last Fill or Written Date and Quantity:  Last Office Visit and Type:  Next Office Visit and Type:  Last Controlled Substance Agreement Date:  Last UDS:

## 2019-10-25 NOTE — Telephone Encounter (Signed)
Name of Medication: Adderall 20 mg Name of [facility]: [facility] Last Fill or Written Date and Quantity:# 30 on 09/19/19  Last Office Visit and Type: 04/27/2019 annual Next Office Visit and Type: 10/26/19 for 6 mth FU

## 2019-10-26 ENCOUNTER — Ambulatory Visit: Payer: 59 | Admitting: Internal Medicine

## 2019-10-26 ENCOUNTER — Other Ambulatory Visit: Payer: Self-pay

## 2019-10-26 ENCOUNTER — Encounter: Payer: Self-pay | Admitting: Internal Medicine

## 2019-10-26 DIAGNOSIS — F39 Unspecified mood [affective] disorder: Secondary | ICD-10-CM | POA: Diagnosis not present

## 2019-10-26 DIAGNOSIS — I10 Essential (primary) hypertension: Secondary | ICD-10-CM | POA: Diagnosis not present

## 2019-10-26 DIAGNOSIS — F900 Attention-deficit hyperactivity disorder, predominantly inattentive type: Secondary | ICD-10-CM | POA: Diagnosis not present

## 2019-10-26 DIAGNOSIS — E291 Testicular hypofunction: Secondary | ICD-10-CM

## 2019-10-26 NOTE — Assessment & Plan Note (Signed)
Helped by adderall

## 2019-10-26 NOTE — Progress Notes (Signed)
Subjective:    Patient ID: Logan Wise, male    DOB: April 04, 1961, 59 y.o.   MRN: [id number]  HPI Here for follow up of HTN and depression This visit occurred during the SARS-CoV-2 public health emergency.  Safety protocols were in place, including screening questions prior to the visit, additional usage of staff PPE, and extensive cleaning of exam room while observing appropriate contact time as indicated for disinfecting solutions.   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 albuterol (VENTOLIN HFA) 108 (90 Base) MCG/ACT inhaler Inhale 2 puffs into the lungs every 6 (six) hours as needed for wheezing or shortness of breath. 18 g 1  . amphetamine-dextroamphetamine (ADDERALL) 20 MG tablet Take 1 tablet (20 mg total) by mouth daily. 30 tablet 0  . citalopram (CELEXA) 20 MG tablet TAKE ONE TABLET BY MOUTH DAILY 90 tablet 3  . tadalafil (CIALIS) 20 MG tablet Take 1 tablet (20 mg total) by mouth daily as needed for erectile dysfunction. 6 tablet 11  . testosterone cypionate (DEPOTESTOTERONE CYPIONATE) 200 MG/ML injection Inject into the muscle every 14 (fourteen) days.      . valsartan-hydrochlorothiazide (DIOVAN-HCT) 320-25 MG tablet Take 1 tablet by mouth daily. 90 tablet 3   No current facility-administered medications on file prior to visit.    Allergies  Allergen Reactions  . Amoxicillin-Pot Clavulanate Hives    Hives Other reaction(s): ANAPHYLAXIS Hives Hives Other reaction(s): ANAPHYLAXIS Hives  Other reaction(s): ANAPHYLAXIS Hives Hives  Hives Other reaction(s): ANAPHYLAXIS Hives Hives Other reaction(s): ANAPHYLAXIS Hives  . Peanut Oil  Shortness Of Breath and Swelling  . Peanut-Containing Drug Products Swelling    Past Medical History:  Diagnosis Date  . Allergy   . Anxiety   . Arthritis   . Asthma   . Depression   . GERD (gastroesophageal reflux disease)   . Hyperlipidemia   . Hypothyroid 2011  . Low testosterone     Past Surgical History:  Procedure Laterality Date  . ANKLE RECONSTRUCTION Left 05/2015  . HERNIA REPAIR  05/29/14  . KNEE ARTHROSCOPY     right  . NASAL STENOSIS REPAIR    . ROTATOR CUFF REPAIR     left 12/07, right 3/05 ([facility])  . SEPTOPLASTY      Family History  Problem Relation Age of Onset  . Cancer Mother        cervical  . Diabetes Father   . Heart disease Father   . Hypertension Father     Social History   Socioeconomic History  . Marital status: Married    Spouse name: Not on file  . Number of children: 3  . Years of education: Not on file  . Highest education level: Not on file  Occupational History  . Occupation: outside Press photographer    Comment: [employer]  Tobacco Use  . Smoking status: Former Research scientist (life sciences)  . Smokeless tobacco: Never Used  Substance and Sexual Activity  . Alcohol use: Yes    Alcohol/week: 0.0 standard drinks  . Drug use: No  . Sexual activity: Not on file  Other Topics Concern  . Not on file  Social History Narrative  . Not on file  Social Determinants of Health   Financial Resource Strain:   . Difficulty of Paying Living Expenses:   Food Insecurity:   . Worried About Charity fundraiser in the Last Year:   . Arboriculturist in the Last Year:   Transportation Needs:   . Film/video editor (Medical):   Marland Kitchen Lack of Transportation (Non-Medical):   Physical Activity:   . Days of Exercise per Week:   . Minutes of Exercise per Session:   Stress:   . Feeling of Stress :   Social Connections:   . Frequency of Communication with Friends and Family:   . Frequency of Social Gatherings with Friends and Family:   . Attends Religious Services:   .  Active Member of Clubs or Organizations:   . Attends Archivist Meetings:   Marland Kitchen Marital Status:   Intimate Partner Violence:   . Fear of Current or Ex-Partner:   . Emotionally Abused:   Marland Kitchen Physically Abused:   . Sexually Abused:    Review of Systems Sleeps well Continues to use the testosterone weekly    Objective:   Physical Exam  Constitutional: He appears well-developed. No distress.  Neck: No thyromegaly present.  Cardiovascular: Normal rate, regular rhythm and normal heart sounds. Exam reveals no gallop.  No murmur heard. Respiratory: Effort normal and breath sounds normal. No respiratory distress. He has no wheezes. He has no rales.  Musculoskeletal:        General: No edema.  Lymphadenopathy:    He has no cervical adenopathy.  Psychiatric: He has a normal mood and affect. His behavior is normal.           Assessment & Plan:

## 2019-10-26 NOTE — Assessment & Plan Note (Signed)
Does well with the weekly testosterone

## 2019-10-26 NOTE — Assessment & Plan Note (Addendum)
Chronic depression  Better on the citalopram and the adderall as augmentation Will continue both of these

## 2019-10-26 NOTE — Assessment & Plan Note (Signed)
BP Readings from Last 3 Encounters:  10/26/19 114/78  04/27/19 126/70  08/09/18 140/74   Doing well on valsartan/HCTZ No changes needed

## 2019-11-08 ENCOUNTER — Other Ambulatory Visit: Payer: Self-pay | Admitting: Dermatology

## 2019-12-12 ENCOUNTER — Other Ambulatory Visit: Payer: Self-pay

## 2019-12-12 MED ORDER — AMPHETAMINE-DEXTROAMPHETAMINE 20 MG PO TABS: 20.0000 mg | ORAL_TABLET | Freq: Every day | ORAL | 0 refills | Status: DC

## 2019-12-12 NOTE — Telephone Encounter (Signed)
Name of [place] 20 mg  Name of Pharmacy: [facility] or Written Date and Quantity: #30 on 10/25/19 Last Office Visit and Type:10/26/19 for 6 mth FU  Next Office Visit and Type:05/09/20/CPX    In v/m pt noted has enough med to last thru 12/13/19.

## 2019-12-13 ENCOUNTER — Ambulatory Visit: Payer: 59 | Admitting: Dermatology

## 2019-12-15 ENCOUNTER — Other Ambulatory Visit: Payer: Self-pay

## 2019-12-15 ENCOUNTER — Ambulatory Visit: Payer: 59 | Admitting: Dermatology

## 2019-12-15 DIAGNOSIS — L820 Inflamed seborrheic keratosis: Secondary | ICD-10-CM

## 2019-12-15 DIAGNOSIS — Z1283 Encounter for screening for malignant neoplasm of skin: Secondary | ICD-10-CM | POA: Diagnosis not present

## 2019-12-15 DIAGNOSIS — Z8619 Personal history of other infectious and parasitic diseases: Secondary | ICD-10-CM | POA: Diagnosis not present

## 2019-12-15 DIAGNOSIS — D229 Melanocytic nevi, unspecified: Secondary | ICD-10-CM | POA: Diagnosis not present

## 2019-12-15 MED ORDER — VALACYCLOVIR HCL 1 G PO TABS: ORAL_TABLET | ORAL | 11 refills | Status: DC

## 2019-12-15 NOTE — Progress Notes (Signed)
   Follow-Up Visit   Subjective  Logan Wise is a 59 y.o. male who presents for the following: Areas of Concern and Medication Refill. The patient presents for Upper Body Skin Exam (UBSE) for skin cancer screening and mole check.  Patient presents today for a few areas of concern on his left upper arm, left lower leg and on his back he would like to have evaluated today and would also like to have his medications refilled for medication for fever blisters.  The following portions of the chart were reviewed this encounter and updated as appropriate:  Tobacco  Allergies  Meds  Problems  Med Hx  Surg Hx  Fam Hx      Review of Systems:  No other skin or systemic complaints except as noted in HPI or Assessment and Plan.  Objective  Well appearing patient in no apparent distress; mood and affect are within normal limits.  A focused examination was performed including extremities, including the arms, hands, fingers, and fingernails and the legs, feet, toes, and toenails. Relevant physical exam findings are noted in the Assessment and Plan.  Objective  Trunk and extremities x 18 (18): Erythematous keratotic or waxy stuck-on papule or plaque.    Assessment & Plan  History of cold sores/fever blisters/HSV Lips  Start Valacyclovir take  two 1g pills immediately with symptoms of fever blister onset; then Two 1g  pills twelve hrs later.  Or  One Valtrex (1g) pill twice daily for 5 days  Or  for prevention One pill valacyclovir (1g) qd or bid.  Should especially consider preventive treatment when exposed to sun, trauma and stress.  valACYclovir (VALTREX) 1000 MG tablet - Lips  Inflamed seborrheic keratosis (18) Trunk and extremities x 18  Prior to procedure, discussed risks of blister formation, small wound, skin dyspigmentation, or rare scar following cryotherapy.   Cryotherapy today  Destruction of lesion - Trunk and extremities x 18 Complexity: simple   Destruction  method: cryotherapy   Informed consent: discussed and consent obtained   Timeout:  patient name, date of birth, surgical site, and procedure verified Lesion destroyed using liquid nitrogen: Yes   Region frozen until ice ball extended beyond lesion: Yes   Outcome: patient tolerated procedure well with no complications   Post-procedure details: wound care instructions given     Melanocytic Nevi - Tan-brown and/or pink-flesh-colored symmetric macules and papules - Benign appearing on exam today - Observation - Call clinic for new or changing moles - Recommend daily use of broad spectrum spf 30+ sunscreen to sun-exposed areas.    Return in about 1 year (around 12/14/2020) for TBSE.  I, Donzetta Kohut, CMA, am acting as scribe for Sarina Ser, MD .  Documentation: I have reviewed the above documentation for accuracy and completeness, and I agree with the above.  Sarina Ser, MD

## 2019-12-15 NOTE — Patient Instructions (Signed)
Recommend daily broad spectrum sunscreen SPF 30+ to sun-exposed areas, reapply every 2 hours as needed. Call for new or changing lesions.  

## 2019-12-18 ENCOUNTER — Encounter: Payer: Self-pay | Admitting: Dermatology

## 2019-12-30 ENCOUNTER — Telehealth: Payer: Self-pay | Admitting: Internal Medicine

## 2019-12-30 DIAGNOSIS — I10 Essential (primary) hypertension: Secondary | ICD-10-CM

## 2019-12-30 DIAGNOSIS — E782 Mixed hyperlipidemia: Secondary | ICD-10-CM

## 2019-12-30 NOTE — Telephone Encounter (Signed)
Patient is requesting to have lab orders put in prior to his 02/08/20 appt with Richardson Dopp. He also wants to know if he should take his BP medication prior to his appt. He is coming in to discuss his BP medication.

## 2019-12-30 NOTE — Telephone Encounter (Signed)
Called and spoke to the patient. He is asking if he can have a "complete blood work up" prior to his appointment with Richardson Dopp, PA on 8/11 so that they can discuss results at the appointment. Will forward to Richardson Dopp, [place] for orders.   Patient also asking if he should take his BP meds prior to coming to appointment. Advised patient to continue current meds as he has been and any changes will be discussed at the visit.

## 2020-01-05 NOTE — Telephone Encounter (Signed)
I called and left patient a message to call back to set up labs 1 week before 02/08/20 office visit with Nicki Reaper. Orders already in for labs.

## 2020-01-05 NOTE — Telephone Encounter (Signed)
CMET, CBC, Lipids 1 week prior to appt. I will put order in Epic. Thanks Richardson Dopp, PA-C    01/05/2020 9:29 AM

## 2020-01-11 NOTE — Telephone Encounter (Signed)
I called and left patient a message to call back to set up labs 1 week before office visit with Scott. Orders already in for labs.

## 2020-01-13 NOTE — Telephone Encounter (Signed)
I called and left patient a message to call back to set up labs 1 week before appointment with Scott on 02/08/20. Orders already in for labs. I have attempted to contact patient three times for this lab appointment, I will close phone note since patient has not called back.

## 2020-01-18 ENCOUNTER — Telehealth: Payer: Self-pay

## 2020-01-18 DIAGNOSIS — Z111 Encounter for screening for respiratory tuberculosis: Secondary | ICD-10-CM

## 2020-01-18 NOTE — Addendum Note (Signed)
Addended by: Jearld Fenton on: 01/18/2020 01:55 PM   Modules accepted: Orders

## 2020-01-18 NOTE — Telephone Encounter (Signed)
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 Pt said he needs to have results by 01/24/20 and pt said he just found out today that he needs this done so soon. pts last tb skin test that was neg was 08/26/2017 and last Quantiferon TB gold blood test was negative on 09/22/2017. Pt said if takes 2 days to get Quantiferon gold blood test back he would need lab test done this wk. Dr Silvio Pate is out of office and advised would get cb next wk and pt request special order put in by another provider this week due to work requirements.Please advise. Pt last appt with Dr Silvio Pate was 6 mth FU on 10/26/19.

## 2020-01-18 NOTE — Telephone Encounter (Signed)
Ordered, have him schedule lab only appt

## 2020-01-24 ENCOUNTER — Other Ambulatory Visit (INDEPENDENT_AMBULATORY_CARE_PROVIDER_SITE_OTHER): Payer: 59

## 2020-01-24 ENCOUNTER — Other Ambulatory Visit: Payer: Self-pay

## 2020-01-24 DIAGNOSIS — Z111 Encounter for screening for respiratory tuberculosis: Secondary | ICD-10-CM | POA: Diagnosis not present

## 2020-01-24 NOTE — Addendum Note (Signed)
Addended by: Pilar Grammes on: 01/24/2020 01:49 PM   Modules accepted: Orders

## 2020-01-24 NOTE — Addendum Note (Signed)
Addended by: Cloyd Stagers on: 01/24/2020 02:08 PM   Modules accepted: Orders

## 2020-01-24 NOTE — Telephone Encounter (Signed)
Pt left v/m requesting cb about lab appt for Quantiferon gold TB blood test; I called pt and scheduled lab appt on 01/24/20 at 1 PM.Pt has no covid symptoms, no travel and no known exposure to + covid.

## 2020-01-27 LAB — QUANTIFERON-TB GOLD PLUS
Mitogen-NIL: >10 IU/mL
NIL: 0.02 IU/mL
QuantiFERON-TB Gold Plus: NEGATIVE
TB1-NIL: 0 IU/mL
TB2-NIL: 0 IU/mL

## 2020-02-06 ENCOUNTER — Ambulatory Visit: Payer: 59 | Admitting: Dermatology

## 2020-02-06 ENCOUNTER — Other Ambulatory Visit: Payer: Self-pay

## 2020-02-06 DIAGNOSIS — R21 Rash and other nonspecific skin eruption: Secondary | ICD-10-CM

## 2020-02-06 MED ORDER — DOXYCYCLINE HYCLATE 100 MG PO TABS
100.0000 mg | ORAL_TABLET | Freq: Two times a day (BID) | ORAL | 0 refills | Status: DC
Start: 2020-02-06 — End: 2020-02-06

## 2020-02-06 MED ORDER — TRIAMCINOLONE ACETONIDE 0.1 % EX CREA: 1.0000 "application " | TOPICAL_CREAM | Freq: Two times a day (BID) | CUTANEOUS | 3 refills | Status: DC | PRN

## 2020-02-06 MED ORDER — DOXYCYCLINE HYCLATE 100 MG PO TABS: 100.0000 mg | ORAL_TABLET | Freq: Every day | ORAL | 1 refills | Status: AC

## 2020-02-06 MED ORDER — KETOCONAZOLE 2 % EX SHAM: MEDICATED_SHAMPOO | CUTANEOUS | 2 refills | Status: DC

## 2020-02-06 NOTE — Progress Notes (Signed)
   Follow-Up Visit   Subjective  Logan Wise is a 59 y.o. male who presents for the following: Pruritis (pt c/o itching all the time for the last 3 months ). Pt report rash itch more at night. He does a lot of outdoor activities and has a lot of trees and woods in his yard and does contact these frequently  The following portions of the chart were reviewed this encounter and updated as appropriate:  Tobacco  Allergies  Meds  Problems  Med Hx  Surg Hx  Fam Hx     Review of Systems:  No other skin or systemic complaints except as noted in HPI or Assessment and Plan.  Objective  Well appearing patient in no apparent distress; mood and affect are within normal limits.  All skin waist up examined.  Objective  Chest - Medial [facility]): Scaly erythematous papules and plaques +/- edema and vesiculation.    Assessment & Plan  Rash = folliculitis versus bite reaction versus contact dermatitis Trunk Folliculitis vs other   Start Doxycycline 100 mg take 1 tablet po daily with food for possible folliculitis Start Ketoconazole 2% shampoo for possible pityrosporum folliculitis Start otc Allegra 180 mg take 1 tablet daily for itch and possible bite reaction  Skin / nail biopsy - Chest - Medial (Center) Type of biopsy: punch   Informed consent: discussed and consent obtained   Timeout: patient name, date of birth, surgical site, and procedure verified   Procedure prep:  Patient was prepped and draped in usual sterile fashion (the patient was cleansed and prepped) Prep type:  Isopropyl alcohol Anesthesia: the lesion was anesthetized in a standard fashion   Anesthetic:  1% lidocaine w/ epinephrine 1-100,000 buffered w/ 8.4% NaHCO3 Punch size:  3 mm Suture size:  4-0 Suture type: nylon   Hemostasis achieved with: suture, pressure and aluminum chloride   Outcome: patient tolerated procedure well   Post-procedure details: sterile dressing applied and wound care instructions given     Dressing type: bandage, petrolatum and pressure dressing    Specimen 1 - Surgical pathology Differential Diagnosis: Folliculitis vs other Check Margins: No Scaly erythematous papules and plaques +/- edema and vesiculation.  Ordered Medications: ketoconazole (NIZORAL) 2 % shampoo doxycycline (VIBRA-TABS) 100 MG tablet triamcinolone cream (KENALOG) 0.1 %  Return in about 1 week (around 02/13/2020) for suture removal .  I, Marye Round, CMA, am acting as scribe for Sarina Ser, MD .  Documentation: I have reviewed the above documentation for accuracy and completeness, and I agree with the above.  Sarina Ser, MD

## 2020-02-06 NOTE — Patient Instructions (Signed)
tart Doxycycline 100 mg take 1 tablet po daily with food  Start Ketoconazole 2% shampoo to scalp once a day  Start otc Allegra 180 mg take 1 tablet daily

## 2020-02-07 ENCOUNTER — Other Ambulatory Visit: Payer: Self-pay | Admitting: Internal Medicine

## 2020-02-07 NOTE — Progress Notes (Deleted)
  Cardiology Office Note:    Date:  02/07/2020   ID:  Kirk Ruths, DOB 1961-03-10, MRN [id number]  PCP:  Venia Carbon, MD  Cardiologist:  Dorris Carnes, MD *** Electrophysiologist:  None   Referring MD: Venia Carbon, MD   Chief Complaint:  No chief complaint on file.    Patient Profile:    Logan Wise is a 59 y.o. male with:   Hypertension   Iron overload  Tx w/ phlebotomy  Asthma  GERD   Hypothyroidism  Low testosterone   Prior CV studies: Echocardiogram 08/18/11 Mild LVH, EF 55-60, no RWMA  History of Present Illness:    Mr. Taney was last seen by Dr. Harrington Challenger in 07/2018.  He returns for follow up.  The DICTATELATER SmartLink is not supported in this context. ***   Past Medical History:  Diagnosis Date  . Allergy   . Anxiety   . Arthritis   . Asthma   . Depression   . Dysplastic nevus 05/31/2009   right periaxillary area near prox bicep  . GERD (gastroesophageal reflux disease)   . Hyperlipidemia   . Hypothyroid 2011  . Low testosterone     Current Medications: No outpatient medications have been marked as taking for the 02/08/20 encounter (Appointment) with Richardson Dopp T, PA-C.     Allergies:   Amoxicillin-pot clavulanate, Peanut oil, and Peanut-containing drug products   Social History   Tobacco Use  . Smoking status: Former Research scientist (life sciences)  . Smokeless tobacco: Never Used  Vaping Use  . Vaping Use: Never used  Substance Use Topics  . Alcohol use: Yes    Alcohol/week: 0.0 standard drinks  . Drug use: No     Family Hx: The patient's family history includes Cancer in his mother; Diabetes in his father; Heart disease in his father; Hypertension in his father.  ROS   EKGs/Labs/Other Test Reviewed:    EKG:  EKG is *** ordered today.  The ekg ordered today demonstrates ***  Recent Labs: 04/27/2019: ALT 29; BUN 16; Creatinine, Ser 1.00; Hemoglobin 16.0; Platelets 192.0; Potassium 4.2; Sodium 138   Recent Lipid Panel Lab  Results  Component Value Date/Time   CHOL 218 (H) 06/14/2018 11:18 AM   TRIG 235 (H) 06/14/2018 11:18 AM   HDL 48 06/14/2018 11:18 AM   CHOLHDL 4.5 06/14/2018 11:18 AM   CHOLHDL 4 12/08/2012 07:53 AM   LDLCALC 123 (H) 06/14/2018 11:18 AM    Physical Exam:    VS:  There were no vitals taken for this visit.    Wt Readings from Last 3 Encounters:  10/26/19 218 lb (98.9 kg)  04/27/19 227 lb (103 kg)  02/10/19 222 lb (100.7 kg)     Physical Exam ***  ASSESSMENT & PLAN:    ***  Dispo:  No follow-ups on file.   Medication Adjustments/Labs and Tests Ordered: Current medicines are reviewed at length with the patient today.  Concerns regarding medicines are outlined above.  Tests Ordered: No orders of the defined types were placed in this encounter.  Medication Changes: No orders of the defined types were placed in this encounter.   Signed, Richardson Dopp, PA-C  02/07/2020 5:18 PM    [facility] [address], [place], [place]  [address] Phone: [phone]; Fax: [phone]

## 2020-02-08 ENCOUNTER — Telehealth: Payer: Self-pay

## 2020-02-08 ENCOUNTER — Ambulatory Visit: Payer: 59 | Admitting: Physician Assistant

## 2020-02-08 NOTE — Telephone Encounter (Signed)
Pt calling and request to speak with [facility] CMA about the last TB skin test that was drawn for pt;I offered to help pt but pt prefers cb from [place].

## 2020-02-08 NOTE — Telephone Encounter (Signed)
Thank you for trying. I spoke to pt and advised him the results were released on MyChart. Told him to send me a message if he cannot see them. I will print them out for him.

## 2020-02-09 ENCOUNTER — Encounter: Payer: Self-pay | Admitting: Dermatology

## 2020-02-09 ENCOUNTER — Other Ambulatory Visit: Payer: Self-pay | Admitting: Internal Medicine

## 2020-02-09 MED ORDER — AMPHETAMINE-DEXTROAMPHETAMINE 20 MG PO TABS: 20.0000 mg | ORAL_TABLET | Freq: Every day | ORAL | 0 refills | Status: DC

## 2020-02-09 NOTE — Telephone Encounter (Signed)
Patient called requesting a refill of amphetamine-dextroamphetamine (ADDERALL) 20 MG   Last prescribed on 12/12/2019 #30 with 0 refill  Last OV on 10/26/2019 with Dr Silvio Pate

## 2020-02-13 ENCOUNTER — Other Ambulatory Visit: Payer: Self-pay

## 2020-02-13 ENCOUNTER — Ambulatory Visit: Payer: 59 | Admitting: Dermatology

## 2020-02-13 ENCOUNTER — Encounter: Payer: Self-pay | Admitting: Dermatology

## 2020-02-13 DIAGNOSIS — Z4802 Encounter for removal of sutures: Secondary | ICD-10-CM

## 2020-02-13 DIAGNOSIS — R21 Rash and other nonspecific skin eruption: Secondary | ICD-10-CM

## 2020-02-13 DIAGNOSIS — S1086XD Insect bite of other specified part of neck, subsequent encounter: Secondary | ICD-10-CM

## 2020-02-13 DIAGNOSIS — W57XXXD Bitten or stung by nonvenomous insect and other nonvenomous arthropods, subsequent encounter: Secondary | ICD-10-CM

## 2020-02-13 DIAGNOSIS — W57XXXA Bitten or stung by nonvenomous insect and other nonvenomous arthropods, initial encounter: Secondary | ICD-10-CM

## 2020-02-13 MED ORDER — TRIAMCINOLONE ACETONIDE 0.1 % EX CREA: 1.0000 "application " | TOPICAL_CREAM | Freq: Two times a day (BID) | CUTANEOUS | 3 refills | Status: DC | PRN

## 2020-02-13 NOTE — Progress Notes (Signed)
   Follow-Up Visit   Subjective  Logan Wise is a 59 y.o. male who presents for the following: Follow-up. Patient presents today for follow up on OV 02/06/20 for rash and suture removal from punch biopsy preformed on 02/06/20.  Bite Reaction bx proven  The following portions of the chart were reviewed this encounter and updated as appropriate:  Tobacco  Allergies  Meds  Problems  Med Hx  Surg Hx  Fam Hx     Review of Systems:  No other skin or systemic complaints except as noted in HPI or Assessment and Plan.  Objective  Well appearing patient in no apparent distress; mood and affect are within normal limits.  A focused examination was performed including trunk. Relevant physical exam findings are noted in the Assessment and Plan.  Objective  Neck - Posterior: Incision site is clean, dry and intact   Objective  Right Breast: Scaly erythematous papules and plaques +/- edema and vesiculation.   Much reduced today    Assessment & Plan  Encounter for removal of sutures Neck - Posterior  Wound cleansed, sutures removed, wound cleansed band aid  applied. Discussed pathology results.   Reaction to insect bite - biopsy proven  (pathology showed pattern consistent with "DERMAL HYPERSENSITIVITY REACTION" which is seen in bite reaction vs medication reaction.  The Clinico-pathologic correlation is most consistent with bite reaction in this patient and history etc) Trunk and extremities Biopsy proven Bite reaction Continue TMC cream once to twice daily prn itch Continue Allegra once daily D/C Doxycycline 100 mg QD D/C Ketoconazole shampoo  Discussed avoidance of woods and trees if possible.  He feels it is from woods around his house that he goes through.   Discussed spraying yard for mosquitoes; ticks; mites which he does with a "Mosquito [employer]" like service.  Rash  Reordered Medications triamcinolone cream (KENALOG) 0.1 %  Other Related Medications ketoconazole  (NIZORAL) 2 % shampoo doxycycline (VIBRA-TABS) 100 MG tablet  Return if symptoms worsen or fail to improve.  Marene Lenz, CMA, am acting as scribe for Sarina Ser, MD . Documentation: I have reviewed the above documentation for accuracy and completeness, and I agree with the above.  Sarina Ser, MD

## 2020-02-13 NOTE — Patient Instructions (Signed)
Recommend daily broad spectrum sunscreen SPF 30+ to sun-exposed areas, reapply every 2 hours as needed. Call for new or changing lesions.  

## 2020-02-16 ENCOUNTER — Encounter: Payer: Self-pay | Admitting: Dermatology

## 2020-02-20 ENCOUNTER — Other Ambulatory Visit: Payer: Self-pay | Admitting: *Deleted

## 2020-02-20 MED ORDER — ALBUTEROL SULFATE HFA 108 (90 BASE) MCG/ACT IN AERS: 2.0000 | INHALATION_SPRAY | Freq: Four times a day (QID) | RESPIRATORY_TRACT | 1 refills | Status: DC | PRN

## 2020-02-20 NOTE — Telephone Encounter (Signed)
Patient left a voicemail requesting a refill on his Albuterol inhaler Pharmacy [facility] [place]

## 2020-02-20 NOTE — Telephone Encounter (Signed)
Last apt 02/13/20 Next apt 05/09/20. Sent in script

## 2020-02-27 ENCOUNTER — Other Ambulatory Visit: Payer: Self-pay | Admitting: *Deleted

## 2020-02-27 NOTE — Telephone Encounter (Signed)
Patient left a voicemail stating that Logan Wise has been trying to get a refill on his testosterone for about a week. Patient requested that the refill be sent in.

## 2020-02-27 NOTE — Telephone Encounter (Signed)
Left a message on voicemail for patient to call the office back. 

## 2020-02-27 NOTE — Telephone Encounter (Signed)
Appears to be getting from Dr. Lavera Guise. He should contact them.

## 2020-02-27 NOTE — Telephone Encounter (Signed)
No record of refill request from pharmacy.  Dr Silvio Pate is out of the office today.

## 2020-02-27 NOTE — Telephone Encounter (Signed)
It does not appear Dr. Silvio Pate has filled this. Who has been filling for him?

## 2020-02-28 MED ORDER — TESTOSTERONE CYPIONATE 200 MG/ML IM SOLN: 200.0000 mg | INTRAMUSCULAR | 5 refills | Status: DC

## 2020-02-28 NOTE — Telephone Encounter (Signed)
Discussed with patient, he says that at his last OV with Dr. Silvio Pate, MD asked patient if he would like for [place] to start prescribing his testosterone.    Patient does want Dr. Silvio Pate to take over prescribing and states that he takes the testosterone as follows:  Testerone Cypionate 200mg /ml Self injects 1 cc per week  # 4 Vials for 1 month supply w refills.   He is due now for refill at [facility] on [address].   Will pend for Dr. Alla German review if fill is appropriate.   Thanks.

## 2020-04-02 ENCOUNTER — Telehealth: Payer: Self-pay | Admitting: *Deleted

## 2020-04-02 NOTE — Telephone Encounter (Signed)
Patient called stating that he had a covid test done at [facility] the first of September and it was positive. Patient stated that he never saw anyone because he had covid. Patient stated he had a PCR covid test done 2 weeks later and it was negative. Patient stated that he had some blood work done thru [facility] about 2 months ago and his iron level was low. Patient stated that he has been real tired for about 3-4 months and wants to know if you will order some lab work to check his iron levels to see if he is anemic?

## 2020-04-02 NOTE — Telephone Encounter (Signed)
Left message to call office. It would be 1230 tomorrow or the end of the day Thursday.

## 2020-04-02 NOTE — Telephone Encounter (Signed)
He can do 12:15 tomorrow if he can make that. Or the end of day on Thursday

## 2020-04-02 NOTE — Telephone Encounter (Signed)
He should be seen first if he doesn't feel right---not just order blood tests

## 2020-04-02 NOTE — Telephone Encounter (Signed)
Pt is asking when he can be seen. Please let me know where I can put him on the schedule, Virtual or in person. He is not wanting to wait 2 weeks to be seen.

## 2020-04-03 ENCOUNTER — Other Ambulatory Visit: Payer: Self-pay

## 2020-04-03 ENCOUNTER — Encounter: Payer: Self-pay | Admitting: Internal Medicine

## 2020-04-03 ENCOUNTER — Ambulatory Visit (INDEPENDENT_AMBULATORY_CARE_PROVIDER_SITE_OTHER): Payer: 59 | Admitting: Internal Medicine

## 2020-04-03 DIAGNOSIS — R5383 Other fatigue: Secondary | ICD-10-CM

## 2020-04-03 LAB — IRON: Iron: 149 ug/dL (ref 42–165)

## 2020-04-03 LAB — COMPREHENSIVE METABOLIC PANEL
ALT: 27 U/L (ref 0–53)
AST: 21 U/L (ref 0–37)
Albumin: 4.6 g/dL (ref 3.5–5.2)
Alkaline Phosphatase: 53 U/L (ref 39–117)
BUN: 27 mg/dL — ABNORMAL HIGH (ref 6–23)
CO2: 30 mEq/L (ref 19–32)
Calcium: 9.6 mg/dL (ref 8.4–10.5)
Chloride: 97 mEq/L (ref 96–112)
Creatinine, Ser: 1.13 mg/dL (ref 0.40–1.50)
GFR: 70.63 mL/min (ref >60.00)
Glucose, Bld: 97 mg/dL (ref 70–99)
Potassium: 4.2 mEq/L (ref 3.5–5.1)
Sodium: 135 mEq/L (ref 135–145)
Total Bilirubin: 1 mg/dL (ref 0.2–1.2)
Total Protein: 7.1 g/dL (ref 6.0–8.3)

## 2020-04-03 LAB — IBC + FERRITIN
Ferritin: 33.4 ng/mL (ref 22.0–322.0)
Iron: 149 ug/dL (ref 42–165)
Saturation Ratios: 38.6 % (ref 20.0–50.0)
Transferrin: 276 mg/dL (ref 212.0–360.0)

## 2020-04-03 LAB — CBC
HCT: 47.1 % (ref 39.0–52.0)
Hemoglobin: 16.1 g/dL (ref 13.0–17.0)
MCHC: 34.2 g/dL (ref 30.0–36.0)
MCV: 87.3 fl (ref 78.0–100.0)
Platelets: 185 10*3/uL (ref 150.0–400.0)
RBC: 5.4 Mil/uL (ref 4.22–5.81)
RDW: 14.8 % (ref 11.5–15.5)
WBC: 4.9 10*3/uL (ref 4.0–10.5)

## 2020-04-03 LAB — SEDIMENTATION RATE: Sed Rate: 4 mm/hr (ref 0–20)

## 2020-04-03 LAB — T4, FREE: Free T4: 0.77 ng/dL (ref 0.60–1.60)

## 2020-04-03 LAB — VITAMIN B12: Vitamin B-12: 519 pg/mL (ref 211–911)

## 2020-04-03 NOTE — Assessment & Plan Note (Signed)
Preceded COVID---but now hard to judge since some of his symptoms may be post COVID ?low iron---will recheck labs Nothing of concern on labs No further testing if labs okay now

## 2020-04-03 NOTE — Telephone Encounter (Signed)
Pt called back and made appt for today

## 2020-04-03 NOTE — Progress Notes (Signed)
Subjective:    Patient ID: Logan Wise, male    DOB: 1960/12/29, 59 y.o.   MRN: [id number]  HPI Here due to fatigue This visit occurred during the SARS-CoV-2 public health emergency.  Safety protocols were in place, including screening questions prior to the visit, additional usage of staff PPE, and extensive cleaning of exam room while observing appropriate contact time as indicated for disinfecting solutions.   "I am just tired, exhausted" Goes back a couple of months---even before the COVID infection  Notes sedation after carb meals---but otherwise not sleepy Not able to work out like usual  Cough during the COVID---but mostly gone now No SOB No chest pain or palpitations No orthostatic dizziness or syncope  Was told he was "close to anemic" Order from chiropractor/homeopathic doctor Does donate blood---last time 2.5-3 months ago  Current Outpatient Medications on File Prior to Visit  Medication Sig Dispense Refill   albuterol (VENTOLIN HFA) 108 (90 Base) MCG/ACT inhaler Inhale 2 puffs into the lungs every 6 (six) hours as needed for wheezing or shortness of breath. 18 g 1   amLODipine (NORVASC) 5 MG tablet      amphetamine-dextroamphetamine (ADDERALL) 20 MG tablet Take 1 tablet (20 mg total) by mouth daily. 30 tablet 0   citalopram (CELEXA) 20 MG tablet TAKE ONE TABLET BY MOUTH DAILY 90 tablet 3   esomeprazole (NEXIUM) 40 MG capsule Take by mouth.     famciclovir (FAMVIR) 250 MG tablet TAKE AS DIRECTED - PLEASE NOTE THESE ARE 250MG  TABLETS SO WILL NEED TO TAKE 2 TABLETS TO EQUAL 500MG  *NEED APPOINTMENT FOR FURTHER REFILLS* 60 tablet 0   tadalafil (CIALIS) 20 MG tablet TAKE ONE TABLET BY MOUTH DAILY AS NEEDED FOR ERECTILE DYSFUNCTION 6 tablet 11   testosterone cypionate (DEPOTESTOSTERONE CYPIONATE) 200 MG/ML injection Inject 1 mL (200 mg total) into the muscle every 7 (seven) days. 4 mL 5   valsartan-hydrochlorothiazide (DIOVAN-HCT) 320-25 MG tablet Take 1  tablet by mouth daily. 90 tablet 3   No current facility-administered medications on file prior to visit.    Allergies  Allergen Reactions   Amoxicillin-Pot Clavulanate Anaphylaxis and Hives   Peanut Oil Shortness Of Breath and Swelling   Egg [Eggs Or Egg-Derived Products] Other (See Comments)    Feels like having the flu   Peanut-Containing Drug Products Swelling    Past Medical History:  Diagnosis Date   Allergy    Anxiety    Arthritis    Asthma    Depression    Dysplastic nevus 05/31/2009   right periaxillary area near prox bicep   GERD (gastroesophageal reflux disease)    Hyperlipidemia    Hypothyroid 2011   Low testosterone     Past Surgical History:  Procedure Laterality Date   ANKLE RECONSTRUCTION Left 05/2015   HERNIA REPAIR  05/29/14   KNEE ARTHROSCOPY     right   NASAL STENOSIS REPAIR     ROTATOR CUFF REPAIR     left 12/07, right 3/05 ([facility])   SEPTOPLASTY      Family History  Problem Relation Age of Onset   Cancer Mother        cervical   Diabetes Father    Heart disease Father    Hypertension Father     Social History   Socioeconomic History   Marital status: Married    Spouse name: Not on file   Number of children: 3   Years of education: Not on file   Highest education level:  Not on file  Occupational History   Occupation: outside sales    Comment: [employer]  Tobacco Use   Smoking status: Former Smoker   Smokeless tobacco: Never Used  Scientific laboratory technician Use: Never used  Substance and Sexual Activity   Alcohol use: Yes    Alcohol/week: 0.0 standard drinks   Drug use: No   Sexual activity: Not on file  Other Topics Concern   Not on file  Social History Narrative   Not on file   Social Determinants of Health   Financial Resource Strain:    Difficulty of Paying Living Expenses: Not on file  Food Insecurity:    Worried About [place] in the Last Year: Not on file   Ran Out  of Food in the Last Year: Not on file  Transportation Needs:    Lack of Transportation (Medical): Not on file   Lack of Transportation (Non-Medical): Not on file  Physical Activity:    Days of Exercise per Week: Not on file   Minutes of Exercise per Session: Not on file  Stress:    Feeling of Stress : Not on file  Social Connections:    Frequency of Communication with Friends and Family: Not on file   Frequency of Social Gatherings with Friends and Family: Not on file   Attends Religious Services: Not on file   Active Member of Clubs or Organizations: Not on file   Attends Archivist Meetings: Not on file   Marital Status: Not on file  Intimate Partner Violence:    Fear of Current or Ex-Partner: Not on file   Emotionally Abused: Not on file   Physically Abused: Not on file   Sexually Abused: Not on file   Review of Systems Appetite is okay Weight stable Same testosterone dose No N/V Bowels are normal No trouble voiding No change in hot/cold tolerance Rash on chest--was put on doxycycline (by Dr Nehemiah Massed) Still some headaches since COVID Smell and taste are not back to normal Not depressed    Objective:   Physical Exam Constitutional:      Appearance: Normal appearance.  HENT:     Mouth/Throat:     Comments: No oral lesions Cardiovascular:     Rate and Rhythm: Normal rate and regular rhythm.     Pulses: Normal pulses.     Heart sounds: No murmur heard.  No gallop.   Pulmonary:     Effort: Pulmonary effort is normal.     Breath sounds: Normal breath sounds. No wheezing or rales.  Abdominal:     Palpations: Abdomen is soft. There is no mass.     Tenderness: There is no abdominal tenderness.  Musculoskeletal:     Cervical back: Neck supple.     Right lower leg: No edema.     Left lower leg: No edema.  Lymphadenopathy:     Head:     Right side of head: No submental, submandibular, tonsillar, preauricular, posterior auricular or occipital  adenopathy.     Left side of head: No submental, submandibular, tonsillar, preauricular, posterior auricular or occipital adenopathy.     Cervical: No cervical adenopathy.     Upper Body:     Right upper body: No supraclavicular or axillary adenopathy.     Left upper body: No supraclavicular or axillary adenopathy.     Lower Body: No right inguinal adenopathy. No left inguinal adenopathy.  Skin:    General: Skin is warm.  Findings: No rash.  Neurological:     General: No focal deficit present.     Mental Status: He is alert and oriented to person, place, and time.  Psychiatric:        Mood and Affect: Mood normal.        Behavior: Behavior normal.            Assessment & Plan:

## 2020-04-03 NOTE — Telephone Encounter (Signed)
Left message and advised him to call and see if he can make an appt for 1230 today. If not, I will have to give it to someone else. I will give him until 930.

## 2020-04-11 ENCOUNTER — Other Ambulatory Visit: Payer: Self-pay | Admitting: Internal Medicine

## 2020-04-11 ENCOUNTER — Other Ambulatory Visit: Payer: Self-pay

## 2020-04-11 NOTE — Telephone Encounter (Signed)
Name of Medication: Adderall 20 mg Name of Pharmacy: [facility] or Written Date and Quantity: # 30 X 0 on 02/09/20 Last Office Visit and Type:acute fatigue 04/03/20  and 6 mth FU 10/26/19  Next Office Visit and Type: 05/09/2020 CPX

## 2020-04-12 MED ORDER — AMPHETAMINE-DEXTROAMPHETAMINE 20 MG PO TABS
20.0000 mg | ORAL_TABLET | Freq: Every day | ORAL | 0 refills | Status: DC
Start: 2020-04-12 — End: 2020-06-18

## 2020-05-09 ENCOUNTER — Encounter: Payer: 59 | Admitting: Internal Medicine

## 2020-06-04 ENCOUNTER — Telehealth: Payer: Self-pay | Admitting: Internal Medicine

## 2020-06-04 MED ORDER — VALSARTAN-HYDROCHLOROTHIAZIDE 320-25 MG PO TABS: 1.0000 | ORAL_TABLET | Freq: Every day | ORAL | 0 refills | Status: DC

## 2020-06-04 NOTE — Telephone Encounter (Signed)
Pt's medication was sent to pt's pharmacy as requested. Confirmation received.  °

## 2020-06-04 NOTE — Telephone Encounter (Signed)
Left detailed message on self-identified VM that he does have labs orders for prior to his last visit in Aug (no show for this one)  I asked him to call back and schedule what day this week he can come by for labs.  Order in [place] for BMET, CBC and LIPIDS.

## 2020-06-04 NOTE — Telephone Encounter (Signed)
New Message:    Pt is scheduled to see Dr Harrington Challenger on 06-08-20. H e wants to know if he needs lab work before his appt?

## 2020-06-04 NOTE — Telephone Encounter (Signed)
*  STAT* If patient is at the pharmacy, call can be transferred to refill team.   1. Which medications need to be refilled? (please list name of each medication and dose if known)  Valsartan  2. Which pharmacy/location (including street and city if local pharmacy) is medication to be sent to? [facility], [place]  3. Do they need a 30 day or 90 day supply? Enough until his appt on Friday(06-08-20)

## 2020-06-07 NOTE — Progress Notes (Signed)
Cardiology Office Note   Date:  06/08/2020   ID:  Logan Wise, DOB 03/06/1961, MRN [id number]  PCP:  Venia Carbon, MD  Cardiologist:   Dorris Carnes, MD   F/U of HTN      History of Present Illness: Logan Wise is a 59 y.o. male with a history of HTN, iron overload (treated with phlebotomy)   Echo in 2013 normal   He was seen by P [place] in the past   Last time seen Jan 2016  He was on amlodipine at that time    Stopped at some point  I saw the pt in Feb 2020  Since seen the pt says his breathing is OK   He denies CP    He does say he has a very sensitive intestines  Gluten intolerant   Has problems    Current Meds  Medication Sig  . albuterol (VENTOLIN HFA) 108 (90 Base) MCG/ACT inhaler Inhale 2 puffs into the lungs every 6 (six) hours as needed for wheezing or shortness of breath.  . amphetamine-dextroamphetamine (ADDERALL) 20 MG tablet Take 1 tablet (20 mg total) by mouth daily.  . citalopram (CELEXA) 20 MG tablet TAKE ONE TABLET BY MOUTH DAILY  . esomeprazole (NEXIUM) 40 MG capsule Take by mouth.  . tadalafil (CIALIS) 20 MG tablet TAKE ONE TABLET BY MOUTH DAILY AS NEEDED FOR ERECTILE DYSFUNCTION  . testosterone cypionate (DEPOTESTOSTERONE CYPIONATE) 200 MG/ML injection Inject 1 mL (200 mg total) into the muscle every 7 (seven) days.  . valsartan-hydrochlorothiazide (DIOVAN-HCT) 320-25 MG tablet Take 1 tablet by mouth daily. Please keep upcoming appt in December with Dr. Harrington Challenger before anymore refills. Thank you     Allergies:   Amoxicillin-pot clavulanate, Peanut oil, Egg [eggs or egg-derived products], and Peanut-containing drug products   Past Medical History:  Diagnosis Date  . Allergy   . Anxiety   . Arthritis   . Asthma   . Depression   . Dysplastic nevus 05/31/2009   right periaxillary area near prox bicep  . GERD (gastroesophageal reflux disease)   . Hyperlipidemia   . Hypothyroid 2011  . Low testosterone     Past Surgical History:   Procedure Laterality Date  . ANKLE RECONSTRUCTION Left 05/2015  . HERNIA REPAIR  05/29/14  . KNEE ARTHROSCOPY     right  . NASAL STENOSIS REPAIR    . ROTATOR CUFF REPAIR     left 12/07, right 3/05 ([facility])  . SEPTOPLASTY       Social History:  The patient  reports that he has quit smoking. He has never used smokeless tobacco. He reports current alcohol use. He reports that he does not use drugs.   Family History:  The patient's family history includes Cancer in his mother; Diabetes in his father; Heart disease in his father; Hypertension in his father.    ROS:  Please see the history of present illness. All other systems are reviewed and  Negative to the above problem except as noted.    PHYSICAL EXAM: VS:  BP (!) 148/72   Pulse 66   Ht 6' (1.829 m)   Wt 231 lb 6.4 oz (105 kg)   SpO2 97%   BMI 31.38 kg/m   GEN:  Obese 59 yo  in no acute distress  HEENT: normal  Neck: no JVD, carotid bruits Cardiac: RRR; no murmurs.  No LE edema  Respiratory:  clear to auscultation bilaterally,  GI: soft, nontender, nondistended, + BS  No  hepatomegaly  MS: no deformity Moving all extremities   Skin: warm and dry, no rash Neuro:  Strength and sensation are intact Psych: euthymic mood, full affect   EKG:  EKG is ordered today.  SR 66 bpm  Possible anteroseptal MI      Lipid Panel    Component Value Date/Time   CHOL 218 (H) 06/14/2018 1118   TRIG 235 (H) 06/14/2018 1118   HDL 48 06/14/2018 1118   CHOLHDL 4.5 06/14/2018 1118   CHOLHDL 4 12/08/2012 0753   VLDL 25.0 12/08/2012 0753   LDLCALC 123 (H) 06/14/2018 1118      Wt Readings from Last 3 Encounters:  06/08/20 231 lb 6.4 oz (105 kg)  04/03/20 222 lb 12 oz (101 kg)  10/26/19 218 lb (98.9 kg)      ASSESSMENT AND PLAN:  1  HTN   BP is still not optimal  Will keep on current regimen   Try 1/2 dose amldipine (2.5 mg)   Follow BP at home      2  Lipids   Will repeat lipids  3   Obesity   Reviewed wt   Discussed diet    WIll check on GI programs for him    Current medicines are reviewed at length with the patient today.  The patient does not have concerns regarding medicines.  Signed, Dorris Carnes, MD  06/08/2020 12:13 PM    [facility] [address], [place], [place]  [address] Phone: [phone]; Fax: [phone]

## 2020-06-08 ENCOUNTER — Other Ambulatory Visit: Payer: Self-pay

## 2020-06-08 ENCOUNTER — Ambulatory Visit: Payer: 59 | Admitting: Internal Medicine

## 2020-06-08 ENCOUNTER — Encounter: Payer: Self-pay | Admitting: Internal Medicine

## 2020-06-08 VITALS — BP 148/72 | HR 66 | Ht 72.0 in | Wt 231.4 lb

## 2020-06-08 DIAGNOSIS — E782 Mixed hyperlipidemia: Secondary | ICD-10-CM

## 2020-06-08 MED ORDER — AMLODIPINE BESYLATE 2.5 MG PO TABS: 2.5000 mg | ORAL_TABLET | Freq: Every day | ORAL | 3 refills | Status: DC

## 2020-06-08 NOTE — Patient Instructions (Signed)
Medication Instructions:  Start amlodipine 2.5 mg daily  *If you need a refill on your cardiac medications before your next appointment, please call your pharmacy*   Lab Work: Today: lipids If you have labs (blood work) drawn today and your tests are completely normal, you will receive your results only by: Marland Kitchen MyChart Message (if you have MyChart) OR . A paper copy in the mail If you have any lab test that is abnormal or we need to change your treatment, we will call you to review the results.   Testing/Procedures: none   Follow-Up: At [facility], you and your health needs are our priority.  As part of our continuing mission to provide you with exceptional heart care, we have created designated Provider Care Teams.  These Care Teams include your primary Cardiologist (physician) and Advanced Practice Providers (APPs -  Physician Assistants and Nurse Practitioners) who all work together to provide you with the care you need, when you need it.     Your next appointment:   9 month(s)  The format for your next appointment:   In Person  Provider:   You may see Dorris Carnes, MD or one of the following Advanced Practice Providers on your designated Care Team:    Richardson Dopp, PA-C  Robbie Lis, [place]   Other Instructions

## 2020-06-09 LAB — LIPID PANEL
Chol/HDL Ratio: 4.3 ratio (ref 0.0–5.0)
Cholesterol, Total: 205 mg/dL — ABNORMAL HIGH (ref 100–199)
HDL: 48 mg/dL (ref >39)
LDL Chol Calc (NIH): 124 mg/dL — ABNORMAL HIGH (ref 0–99)
Triglycerides: 189 mg/dL — ABNORMAL HIGH (ref 0–149)
VLDL Cholesterol Cal: 33 mg/dL (ref 5–40)

## 2020-06-13 NOTE — Addendum Note (Signed)
Addended by: Briant Cedar on: 06/13/2020 10:49 AM   Modules accepted: Orders

## 2020-06-18 ENCOUNTER — Other Ambulatory Visit: Payer: Self-pay | Admitting: *Deleted

## 2020-06-18 MED ORDER — AMPHETAMINE-DEXTROAMPHETAMINE 20 MG PO TABS
20.0000 mg | ORAL_TABLET | Freq: Every day | ORAL | 0 refills | Status: DC
Start: 2020-06-18 — End: 2020-07-19

## 2020-06-18 NOTE — Telephone Encounter (Signed)
Patient left a voicemail requesting a refill on Adderall Last office visit 04/03/20 Upcoming appointment  none scheduled Last refill 04/12/20 #30\ [facility]

## 2020-06-19 ENCOUNTER — Other Ambulatory Visit: Payer: Self-pay | Admitting: Internal Medicine

## 2020-07-09 ENCOUNTER — Other Ambulatory Visit: Payer: Self-pay | Admitting: Internal Medicine

## 2020-07-19 ENCOUNTER — Other Ambulatory Visit: Payer: Self-pay | Admitting: Internal Medicine

## 2020-07-19 MED ORDER — AMPHETAMINE-DEXTROAMPHETAMINE 20 MG PO TABS: 20.0000 mg | ORAL_TABLET | Freq: Every day | ORAL | 0 refills | Status: DC

## 2020-07-19 NOTE — Telephone Encounter (Signed)
Last filled 06-18-20 #30 Last OV 04-03-20 No Future OV [facility]

## 2020-07-19 NOTE — Telephone Encounter (Signed)
1.Medication Requested: amphetamine-dextroamphetamine (ADDERALL) 20 MG tablet  2. Pharmacy (Name, Street, [place]): [place], [place] [facility] Phone:  [phone]  Fax:  [phone]      3. On Med List:Yes    4. Last Visit with PCP: 10.5.21   5. Next visit date with PCP: N/A  Patient is requesting for the refill to be filled for 60 days instead of 30 days to prevent him calling every month to receive refills.  Agent: Please be advised that RX refills may take up to 3 business days. We ask that you follow-up with your pharmacy.

## 2020-07-24 ENCOUNTER — Other Ambulatory Visit: Payer: Self-pay | Admitting: Orthopaedic Surgery

## 2020-07-24 DIAGNOSIS — S46312A Strain of muscle, fascia and tendon of triceps, left arm, initial encounter: Secondary | ICD-10-CM

## 2020-07-29 ENCOUNTER — Other Ambulatory Visit: Payer: Self-pay

## 2020-07-29 ENCOUNTER — Ambulatory Visit
Admission: RE | Admit: 2020-07-29 | Discharge: 2020-07-29 | Disposition: A | Discharge status: Home / Self Care | Payer: 59 | Source: Ambulatory Visit | Attending: Orthopaedic Surgery | Admitting: Orthopaedic Surgery

## 2020-07-29 DIAGNOSIS — S46312A Strain of muscle, fascia and tendon of triceps, left arm, initial encounter: Secondary | ICD-10-CM

## 2020-08-04 ENCOUNTER — Other Ambulatory Visit: Payer: Self-pay | Admitting: Internal Medicine

## 2020-08-13 ENCOUNTER — Other Ambulatory Visit: Payer: Self-pay

## 2020-08-13 NOTE — Telephone Encounter (Signed)
Pt left v/m checking on status of refill of albuterol inhaler Logan Wise in [place] requested last wk. Pt request cb.

## 2020-08-14 ENCOUNTER — Other Ambulatory Visit: Payer: Self-pay | Admitting: Internal Medicine
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
## 2020-08-15 MED ORDER — ALBUTEROL SULFATE HFA 108 (90 BASE) MCG/ACT IN AERS: INHALATION_SPRAY | RESPIRATORY_TRACT | 0 refills | Status: DC

## 2020-08-15 NOTE — Telephone Encounter (Signed)
Spoke to pt. He will call back and schedule appt. I have sent in the refill.

## 2020-08-15 NOTE — Telephone Encounter (Signed)
Okay to give 1 inhaler without refill

## 2020-08-29 ENCOUNTER — Other Ambulatory Visit: Payer: Self-pay | Admitting: Internal Medicine

## 2020-08-29 MED ORDER — AMPHETAMINE-DEXTROAMPHETAMINE 20 MG PO TABS: 20.0000 mg | ORAL_TABLET | Freq: Every day | ORAL | 0 refills | Status: DC

## 2020-08-29 NOTE — Telephone Encounter (Signed)
Last filled 07-19-20 #30 Last OV 04-03-20  Next OV 08-31-20 [facility]

## 2020-08-29 NOTE — Telephone Encounter (Signed)
Pt called in wanted to know about getting a refill on his adderrall   Pharmacy: [facility]

## 2020-08-31 ENCOUNTER — Ambulatory Visit: Payer: 59 | Admitting: Internal Medicine

## 2020-08-31 ENCOUNTER — Other Ambulatory Visit: Payer: Self-pay

## 2020-08-31 ENCOUNTER — Encounter: Payer: Self-pay | Admitting: Internal Medicine

## 2020-08-31 DIAGNOSIS — J4521 Mild intermittent asthma with (acute) exacerbation: Secondary | ICD-10-CM | POA: Diagnosis not present

## 2020-08-31 MED ORDER — MONTELUKAST SODIUM 10 MG PO TABS: 10.0000 mg | ORAL_TABLET | Freq: Every day | ORAL | 3 refills | Status: DC

## 2020-08-31 MED ORDER — ESOMEPRAZOLE MAGNESIUM 40 MG PO CPDR: 40.0000 mg | DELAYED_RELEASE_CAPSULE | Freq: Every day | ORAL | 0 refills | Status: DC

## 2020-08-31 NOTE — Assessment & Plan Note (Signed)
Some worsening It could be related to old pillows he is using in bed to prop his arm Discussed eliminating dander containing objects Will add montelukast  Consider inhaled steroid if symptoms persist

## 2020-08-31 NOTE — Progress Notes (Signed)
Subjective:    Patient ID: Logan Wise, male    DOB: [birth year], 60 y.o.   MRN: [id number]  HPI Here due to worsening of asthma This visit occurred during the SARS-CoV-2 public health emergency.  Safety protocols were in place, including screening questions prior to the visit, additional usage of staff PPE, and extensive cleaning of exam room while observing appropriate contact time as indicated for disinfecting solutions.   Tore triceps tendon on left--needed surgery about a month ago Since then, he has been more aware of asthma Hasn't been active due to injury--though stationary bike for cardio No SOB or cough with exercise  Notices symptoms in morning and late in evening---and occasionally after eating Feels tightness Albuterol helps---but using at least twice a day of late "On the verge" of wheezing History is allergic asthma Did start cetirizine daily in past week  Current Outpatient Medications on File Prior to Visit  Medication Sig Dispense Refill  . albuterol (VENTOLIN HFA) 108 (90 Base) MCG/ACT inhaler INHALE TWO PUFFS BY MOUTH EVERY 6 HOURS AS NEEDED FOR WHEEZING OR SHORTNESS OF BREATH 18 g 0  . amLODipine (NORVASC) 2.5 MG tablet Take 1 tablet (2.5 mg total) by mouth daily. 90 tablet 3  . amphetamine-dextroamphetamine (ADDERALL) 20 MG tablet Take 1 tablet (20 mg total) by mouth daily. 30 tablet 0  . cetirizine (ZYRTEC) 10 MG tablet Take 10 mg by mouth daily.    . citalopram (CELEXA) 20 MG tablet TAKE ONE TABLET BY MOUTH DAILY 90 tablet 3  . esomeprazole (NEXIUM) 40 MG capsule Take by mouth.    . tadalafil (CIALIS) 20 MG tablet TAKE ONE TABLET BY MOUTH DAILY AS NEEDED FOR ERECTILE DYSFUNCTION 6 tablet 11  . testosterone cypionate (DEPOTESTOSTERONE CYPIONATE) 200 MG/ML injection Inject 1 mL (200 mg total) into the muscle every 7 (seven) days. 4 mL 5  . valsartan-hydrochlorothiazide (DIOVAN-HCT) 320-25 MG tablet TAKE 1 TABLET BY MOUTH DAILY. PLEASE KEEP UPCOMING  APPOINTMENT IN DECEMBER WITH DR. ROSS BEFORE ANYMORE REFILLS. THANK YOU 30 tablet 11   No current facility-administered medications on file prior to visit.    Allergies  Allergen Reactions  . Amoxicillin-Pot Clavulanate Anaphylaxis and Hives  . Peanut Oil Shortness Of Breath and Swelling  . Egg [Eggs Or Egg-Derived Products] Other (See Comments)    Feels like having the flu  . Peanut-Containing Drug Products Swelling    Past Medical History:  Diagnosis Date  . Allergy   . Anxiety   . Arthritis   . Asthma   . Depression   . Dysplastic nevus 05/31/2009   right periaxillary area near prox bicep  . GERD (gastroesophageal reflux disease)   . Hyperlipidemia   . Hypothyroid 2011  . Low testosterone     Past Surgical History:  Procedure Laterality Date  . ANKLE RECONSTRUCTION Left 05/2015  . HERNIA REPAIR  05/29/14  . KNEE ARTHROSCOPY     right  . NASAL STENOSIS REPAIR    . ROTATOR CUFF REPAIR     left 12/07, right 3/05 ([facility])  . SEPTOPLASTY      Family History  Problem Relation Age of Onset  . Cancer Mother        cervical  . Diabetes Father   . Heart disease Father   . Hypertension Father     Social History   Socioeconomic History  . Marital status: Married    Spouse name: Not on file  . Number of children: 3  . Years of education:  Not on file  . Highest education level: Not on file  Occupational History  . Occupation: outside Press photographer    Comment: [place]  Tobacco Use  . Smoking status: Former Research scientist (life sciences)  . Smokeless tobacco: Never Used  Vaping Use  . Vaping Use: Never used  Substance and Sexual Activity  . Alcohol use: Yes    Alcohol/week: 0.0 standard drinks  . Drug use: No  . Sexual activity: Not on file  Other Topics Concern  . Not on file  Social History Narrative  . Not on file   Social Determinants of Health   Financial Resource Strain: Not on file  Food Insecurity: Not on file  Transportation Needs: Not on file  Physical Activity: Not on  file  Stress: Not on file  Social Connections: Not on file  Intimate Partner Violence: Not on file   Review of Systems No chest pain No palpitations No active heartburn or dysphagia Relatively new mattress    Objective:   Physical Exam Constitutional:      Appearance: Normal appearance.  Cardiovascular:     Rate and Rhythm: Normal rate and regular rhythm.     Heart sounds: No murmur heard. No gallop.   Pulmonary:     Effort: Pulmonary effort is normal.     Breath sounds: Normal breath sounds. No wheezing or rales.  Musculoskeletal:     Cervical back: Neck supple.  Lymphadenopathy:     Cervical: No cervical adenopathy.  Neurological:     Mental Status: He is alert.            Assessment & Plan:

## 2020-09-05 ENCOUNTER — Other Ambulatory Visit: Payer: Self-pay | Admitting: Internal Medicine

## 2020-09-24 ENCOUNTER — Other Ambulatory Visit: Payer: Self-pay | Admitting: Internal Medicine

## 2020-09-24 ENCOUNTER — Telehealth: Payer: Self-pay | Admitting: Internal Medicine

## 2020-09-24 DIAGNOSIS — Z0283 Encounter for blood-alcohol and blood-drug test: Secondary | ICD-10-CM

## 2020-09-24 NOTE — Telephone Encounter (Signed)
Patient called in stating that his employer requires a 10 panel drug screen yearly for his job. Patient is requesting to have it done here. Can you please order and advise patient when he can come in to have it done? EM

## 2020-09-24 NOTE — Telephone Encounter (Signed)
Does ours count as 10 drug panel? Can we order a different one if not?

## 2020-09-24 NOTE — Telephone Encounter (Signed)
Profile 6

## 2020-09-24 NOTE — Telephone Encounter (Signed)
I left the booklet at your station, let me know which one you want.

## 2020-09-24 NOTE — Progress Notes (Signed)
Opened in error

## 2020-09-25 ENCOUNTER — Other Ambulatory Visit: Payer: Self-pay | Admitting: Internal Medicine

## 2020-09-25 DIAGNOSIS — Z0283 Encounter for blood-alcohol and blood-drug test: Secondary | ICD-10-CM

## 2020-09-26 ENCOUNTER — Other Ambulatory Visit: Payer: 59

## 2020-09-26 ENCOUNTER — Other Ambulatory Visit: Payer: Self-pay | Admitting: Internal Medicine

## 2020-09-26 ENCOUNTER — Other Ambulatory Visit: Payer: Self-pay

## 2020-09-26 DIAGNOSIS — Z0283 Encounter for blood-alcohol and blood-drug test: Secondary | ICD-10-CM

## 2020-09-28 LAB — DRUG MONITORING, PANEL 6 WITH CONFIRMATION, URINE
6 Acetylmorphine: NEGATIVE ng/mL (ref <10)
Alcohol Metabolites: POSITIVE ng/mL — AB
Amphetamine: 2942 ng/mL — ABNORMAL HIGH (ref <250)
Amphetamines: POSITIVE ng/mL — AB (ref <500)
Barbiturates: NEGATIVE ng/mL (ref <300)
Benzodiazepines: NEGATIVE ng/mL (ref <100)
Cocaine Metabolite: NEGATIVE ng/mL (ref <150)
Creatinine: 49.9 mg/dL
Ethyl Glucuronide (ETG): 2052 ng/mL — ABNORMAL HIGH (ref <500)
Ethyl Sulfate (ETS): 406 ng/mL — ABNORMAL HIGH (ref <100)
Marijuana Metabolite: NEGATIVE ng/mL (ref <20)
Methadone Metabolite: NEGATIVE ng/mL (ref <100)
Methamphetamine: NEGATIVE ng/mL (ref <250)
Opiates: NEGATIVE ng/mL (ref <100)
Oxidant: NEGATIVE ug/mL
Oxycodone: NEGATIVE ng/mL (ref <100)
Phencyclidine: NEGATIVE ng/mL (ref <25)
pH: 5.9 (ref 4.5–9.0)

## 2020-09-28 LAB — DM TEMPLATE

## 2020-10-02 ENCOUNTER — Other Ambulatory Visit: Payer: Self-pay

## 2020-10-02 MED ORDER — AMPHETAMINE-DEXTROAMPHETAMINE 20 MG PO TABS: 20.0000 mg | ORAL_TABLET | Freq: Every day | ORAL | 0 refills | Status: DC

## 2020-10-02 NOTE — Telephone Encounter (Signed)
Name of Medication: adderall 20 mg Name of Pharmacy: [facility] [place] Last Fill or Written Date and Quantity:#30 on 08/29/20  Last Office Visit and Type: 08/31/20 for acute visit Next Office Visit and Type: 03/22/21 for CPX  Pt left v/m that took last Adderall on 10/01/20.

## 2020-10-04 ENCOUNTER — Telehealth: Payer: Self-pay

## 2020-10-04 MED ORDER — AMPHETAMINE-DEXTROAMPHETAMINE 20 MG PO TABS: 20.0000 mg | ORAL_TABLET | Freq: Every day | ORAL | 0 refills | Status: DC

## 2020-10-04 NOTE — Telephone Encounter (Signed)
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 Pt is prescribed and takes Adderall which was + on drug screening. There is a third party credentialing co for [facility] called [employer] and they are requiring a Adderall prescription to have on file. Pt asked if on the rx it could be noted that that rx is for documentation and not to be filled. Pt said a letter from Dr Silvio Pate that pt takes Adderall is not acceptable. Pt request cb after reviewed by Dr Silvio Pate.

## 2020-10-04 NOTE — Telephone Encounter (Signed)
Spoke to pt. Rx up front in blue folder for pt to pickup.

## 2020-10-04 NOTE — Telephone Encounter (Signed)
Rx printed for him

## 2020-10-17 ENCOUNTER — Other Ambulatory Visit: Payer: Self-pay | Admitting: Internal Medicine

## 2020-10-20 ENCOUNTER — Other Ambulatory Visit: Payer: Self-pay | Admitting: Internal Medicine

## 2020-10-22 NOTE — Telephone Encounter (Signed)
Last filled 08/08/2020 4 mL Last OV  08/31/2020 Next OV 9/23/2d022 [place]

## 2020-11-05 ENCOUNTER — Other Ambulatory Visit: Payer: Self-pay

## 2020-11-05 MED ORDER — AMPHETAMINE-DEXTROAMPHETAMINE 20 MG PO TABS: 20.0000 mg | ORAL_TABLET | Freq: Every day | ORAL | 0 refills | Status: DC

## 2020-11-05 NOTE — Telephone Encounter (Signed)
Name of Medication: Adderall 20 mg Name of Pharmacy: [facility] or Written Date and Quantity: # 30 on 10/04/2020 Last Office Visit and Type: 08/31/20 acute visit Next Office Visit and Type: 03/22/21 CPX Last Controlled Substance Agreement Date: none seen Last UDS:09/26/2020  In pts v/m requesting Adderall refill pt noted he took last Adderall on 11/04/20.

## 2020-11-06 ENCOUNTER — Ambulatory Visit: Payer: 59 | Admitting: Dermatology

## 2020-11-06 ENCOUNTER — Other Ambulatory Visit: Payer: Self-pay

## 2020-11-06 DIAGNOSIS — L739 Follicular disorder, unspecified: Secondary | ICD-10-CM | POA: Diagnosis not present

## 2020-11-06 DIAGNOSIS — B009 Herpesviral infection, unspecified: Secondary | ICD-10-CM | POA: Diagnosis not present

## 2020-11-06 DIAGNOSIS — Z9889 Other specified postprocedural states: Secondary | ICD-10-CM

## 2020-11-06 MED ORDER — KETOCONAZOLE 2 % EX SHAM: 1.0000 "application " | MEDICATED_SHAMPOO | CUTANEOUS | 3 refills | Status: DC

## 2020-11-06 MED ORDER — VALACYCLOVIR HCL 1 G PO TABS: 1000.0000 mg | ORAL_TABLET | ORAL | 11 refills | Status: DC

## 2020-11-06 MED ORDER — CLINDAMYCIN PHOSPHATE 1 % EX SOLN: Freq: Every day | CUTANEOUS | 3 refills | Status: AC

## 2020-11-06 NOTE — Progress Notes (Signed)
   Follow-Up Visit   Subjective  Logan Wise is a 60 y.o. male who presents for the following: bumps (Scalp, itchy, 90m, ).  He also had possible contact dermatitis in the past on the upper back.  He continued to be itchy but now is doing well taking antihistamines.  He also has some pain along the scar on his elbow where he had ruptured tendon surgery.  The following portions of the chart were reviewed this encounter and updated as appropriate:   Tobacco  Allergies  Meds  Problems  Med Hx  Surg Hx  Fam Hx     Review of Systems:  No other skin or systemic complaints except as noted in HPI or Assessment and Plan.  Objective  Well appearing patient in no apparent distress; mood and affect are within normal limits.  A focused examination was performed including scalp. Relevant physical exam findings are noted in the Assessment and Plan.  Objective  Scalp: Crust post scalp  Objective  Lips: Clear today   Assessment & Plan    Hx pain at surgery site L elbow - Recommend Voltaren gel  Folliculitis/rosacea of the scalp Scalp /Rosacea  Start Ketoconazole 2% shampoo 3x/wk Start clindamycin sol qd to aa scalp  Discussed low dose doxycycline, pt declines Discussed low dose Isotretinoin  Rosacea is a chronic progressive skin condition usually affecting the face of adults, causing redness and/or acne bumps. It is treatable but not curable. It sometimes affects the eyes (ocular rosacea) as well. It may respond to topical and/or systemic medication and can flare with stress, sun exposure, alcohol, exercise and some foods.  Daily application of broad spectrum spf 30+ sunscreen to face is recommended to reduce flares.  ketoconazole (NIZORAL) 2 % shampoo - Scalp  clindamycin (CLEOCIN T) 1 % external solution - Scalp  HSV (herpes simplex virus) infection Lips Cont Valtrex 1g 2 po qt onset of fever blisters, then 2 po 12 hours later Herpes Simplex Virus = Cold Sores = Fever  Blisters is a chronic recurring blistering; scabbing sore-producing viral infection that is recurrent usually in the same area triggered by stress, sun/UV exposure and trauma.  It is infectious and can be spread from person to person by direct contact.  It is not curable, but is treatable with topical and oral medication.  valACYclovir (VALTREX) 1000 MG tablet - Lips  Return if symptoms worsen or fail to improve.  I, Othelia Pulling, RMA, am acting as scribe for Sarina Ser, MD .  Documentation: I have reviewed the above documentation for accuracy and completeness, and I agree with the above.  Sarina Ser, MD

## 2020-11-06 NOTE — Patient Instructions (Signed)

## 2020-11-11 ENCOUNTER — Other Ambulatory Visit: Payer: Self-pay | Admitting: Internal Medicine

## 2020-11-12 ENCOUNTER — Encounter: Payer: Self-pay | Admitting: Dermatology

## 2020-11-27 ENCOUNTER — Telehealth: Payer: Self-pay

## 2020-11-27 DIAGNOSIS — Z209 Contact with and (suspected) exposure to unspecified communicable disease: Secondary | ICD-10-CM

## 2020-11-27 NOTE — Telephone Encounter (Signed)
Pt left v/m requesting order to be put in for quantiferon TB lab test and request cb with appt for the test.

## 2020-11-28 NOTE — Telephone Encounter (Signed)
Order placed. Please schedule him for a lab appt

## 2020-11-28 NOTE — Telephone Encounter (Signed)
Spoke to pt. Made lab appt for tomorrow.

## 2020-11-29 ENCOUNTER — Other Ambulatory Visit: Payer: Self-pay

## 2020-11-29 ENCOUNTER — Other Ambulatory Visit (INDEPENDENT_AMBULATORY_CARE_PROVIDER_SITE_OTHER): Payer: 59

## 2020-11-29 DIAGNOSIS — Z209 Contact with and (suspected) exposure to unspecified communicable disease: Secondary | ICD-10-CM | POA: Diagnosis not present

## 2020-12-01 LAB — QUANTIFERON-TB GOLD PLUS
Mitogen-NIL: 9.96 IU/mL
NIL: 0.03 IU/mL
QuantiFERON-TB Gold Plus: NEGATIVE
TB1-NIL: 0 IU/mL
TB2-NIL: 0 IU/mL

## 2020-12-04 ENCOUNTER — Other Ambulatory Visit: Payer: Self-pay

## 2020-12-04 MED ORDER — AMPHETAMINE-DEXTROAMPHETAMINE 20 MG PO TABS: 20.0000 mg | ORAL_TABLET | Freq: Every day | ORAL | 0 refills | Status: DC

## 2020-12-04 NOTE — Telephone Encounter (Signed)
Last filled 11-05-20 #30 Last OV3-4-22 Next OV 03-22-21 [facility]

## 2020-12-11 ENCOUNTER — Ambulatory Visit: Payer: 59 | Admitting: Dermatology

## 2020-12-31 ENCOUNTER — Other Ambulatory Visit: Payer: Self-pay | Admitting: Internal Medicine

## 2021-01-02 ENCOUNTER — Other Ambulatory Visit: Payer: Self-pay

## 2021-01-02 MED ORDER — AMPHETAMINE-DEXTROAMPHETAMINE 20 MG PO TABS: 20.0000 mg | ORAL_TABLET | Freq: Every day | ORAL | 0 refills | Status: DC

## 2021-01-02 NOTE — Telephone Encounter (Signed)
Name of Medication: adderall 20 mg Name of [facility]acy[facility] or Written Date and Quantity: # 30 on 12/04/20 Last Office Visit and Type: acute on 08/31/2020 Next Office Visit and Type: 03/22/21 CPX  Pt left v/m requesting refill adderall 20 mg on 01/02/21 or 01/03/21; pt is going out of town on 01/04/21.

## 2021-01-24 ENCOUNTER — Other Ambulatory Visit: Payer: Self-pay | Admitting: Internal Medicine

## 2021-02-12 ENCOUNTER — Other Ambulatory Visit: Payer: Self-pay | Admitting: *Deleted

## 2021-02-12 MED ORDER — AMPHETAMINE-DEXTROAMPHETAMINE 20 MG PO TABS: 20.0000 mg | ORAL_TABLET | Freq: Every day | ORAL | 0 refills | Status: DC

## 2021-02-12 NOTE — Telephone Encounter (Signed)
Name of Medication: Adderall Name of Pharmacy: [facility] Last Fill or Written Date and Quantity: 01/02/21 #30 Last Office Visit and Type: 08/31/20 Next Office Visit and Type: 03/22/21 Last Controlled Substance Agreement Date:  Last UDS:09/26/20 Patient left a message that he is out of his medication

## 2021-03-01 ENCOUNTER — Telehealth: Payer: Self-pay | Admitting: Internal Medicine

## 2021-03-01 MED ORDER — TADALAFIL 20 MG PO TABS: ORAL_TABLET | ORAL | 11 refills | Status: DC

## 2021-03-01 NOTE — Telephone Encounter (Signed)
Mr. Logan Wise called in and stated that he spoke with his insurance company and they told him he can get 12 pills of Cialis a month and he would like to go up the 12pills

## 2021-03-01 NOTE — Telephone Encounter (Signed)
error 

## 2021-03-11 ENCOUNTER — Other Ambulatory Visit: Payer: Self-pay | Admitting: Podiatry

## 2021-03-11 DIAGNOSIS — M19072 Primary osteoarthritis, left ankle and foot: Secondary | ICD-10-CM

## 2021-03-15 ENCOUNTER — Encounter: Payer: 59 | Admitting: Internal Medicine

## 2021-03-18 ENCOUNTER — Other Ambulatory Visit: Payer: Self-pay | Admitting: Internal Medicine

## 2021-03-18 MED ORDER — AMPHETAMINE-DEXTROAMPHETAMINE 20 MG PO TABS: 20.0000 mg | ORAL_TABLET | Freq: Every day | ORAL | 0 refills | Status: DC

## 2021-03-18 NOTE — Telephone Encounter (Signed)
  Encourage patient to contact the pharmacy for refills or they can request refills through [place]:  Please schedule appointment if longer than 1 year  NEXT APPOINTMENT DATE:  MEDICATION:amphetamine-dextroamphetamine (ADDERALL) 20 MG tablet  Is the patient out of medication?   [place] [id number] Lorina Rabon  Let patient know to contact pharmacy at the end of the day to make sure medication is ready.  Please notify patient to allow 48-72 hours to process  CLINICAL FILLS OUT ALL BELOW:   LAST REFILL:  QTY:  REFILL DATE:    OTHER COMMENTS:    Okay for refill?  Please advise

## 2021-03-18 NOTE — Telephone Encounter (Signed)
Last Filled 01/02/21 #30 Last Office Visit and Type: 08/31/20 Next Office Visit and Type: 03/22/21 Logan Wise

## 2021-03-20 ENCOUNTER — Ambulatory Visit
Admission: RE | Admit: 2021-03-20 | Discharge: 2021-03-20 | Disposition: A | Discharge status: Home / Self Care | Payer: 59 | Source: Ambulatory Visit | Attending: Podiatry | Admitting: Podiatry

## 2021-03-20 ENCOUNTER — Ambulatory Visit: Payer: 59

## 2021-03-20 ENCOUNTER — Other Ambulatory Visit: Payer: Self-pay

## 2021-03-20 DIAGNOSIS — M19072 Primary osteoarthritis, left ankle and foot: Secondary | ICD-10-CM | POA: Diagnosis not present

## 2021-03-22 ENCOUNTER — Encounter: Payer: Self-pay | Admitting: Internal Medicine

## 2021-03-22 ENCOUNTER — Encounter: Payer: 59 | Admitting: Internal Medicine

## 2021-04-17 ENCOUNTER — Ambulatory Visit: Payer: 59 | Admitting: Physician Assistant

## 2021-04-19 ENCOUNTER — Other Ambulatory Visit: Payer: Self-pay | Admitting: Internal Medicine

## 2021-04-19 MED ORDER — AMPHETAMINE-DEXTROAMPHETAMINE 20 MG PO TABS: 20.0000 mg | ORAL_TABLET | Freq: Every day | ORAL | 0 refills | Status: DC

## 2021-04-19 NOTE — Telephone Encounter (Signed)
  Encourage patient to contact the pharmacy for refills or they can request refills through [facility]:  Please schedule appointment if longer than 1 year  NEXT APPOINTMENT DATE:06/17/21  MEDICATION:amphetamine-dextroamphetamine (ADDERALL) 20 MG tablet  Is the patient out of medication? yes  PHARMACY:[facility] [id number] Logan Wise, [place]  Let patient know to contact pharmacy at the end of the day to make sure medication is ready.  Please notify patient to allow 48-72 hours to process  CLINICAL FILLS OUT ALL BELOW:   LAST REFILL:  QTY:  REFILL DATE:    OTHER COMMENTS:    Okay for refill?  Please advise

## 2021-04-19 NOTE — Telephone Encounter (Signed)
Last Filled 03/18/21 #30 Last Office Visit and Type: 08/31/20 Next Office Visit and Type: 06/17/21 Logan Wise

## 2021-04-19 NOTE — Addendum Note (Signed)
Addended by: Viviana Simpler I on: 04/19/2021 01:46 PM   Modules accepted: Orders

## 2021-04-19 NOTE — Addendum Note (Signed)
Addended by: Pilar Grammes on: 04/19/2021 12:59 PM   Modules accepted: Orders

## 2021-04-25 ENCOUNTER — Other Ambulatory Visit: Payer: Self-pay | Admitting: Internal Medicine

## 2021-04-25 NOTE — Telephone Encounter (Signed)
Last filled 12-25-20 Last OV 08-31-20 Next OV 06-17-21 Logan Wise

## 2021-06-17 ENCOUNTER — Ambulatory Visit (INDEPENDENT_AMBULATORY_CARE_PROVIDER_SITE_OTHER): Payer: 59 | Admitting: Internal Medicine

## 2021-06-17 ENCOUNTER — Other Ambulatory Visit: Payer: Self-pay

## 2021-06-17 ENCOUNTER — Encounter: Payer: Self-pay | Admitting: Internal Medicine

## 2021-06-17 DIAGNOSIS — F39 Unspecified mood [affective] disorder: Secondary | ICD-10-CM | POA: Diagnosis not present

## 2021-06-17 DIAGNOSIS — J4520 Mild intermittent asthma, uncomplicated: Secondary | ICD-10-CM | POA: Diagnosis not present

## 2021-06-17 DIAGNOSIS — Z0000 Encounter for general adult medical examination without abnormal findings: Secondary | ICD-10-CM

## 2021-06-17 DIAGNOSIS — I10 Essential (primary) hypertension: Secondary | ICD-10-CM

## 2021-06-17 DIAGNOSIS — K219 Gastro-esophageal reflux disease without esophagitis: Secondary | ICD-10-CM

## 2021-06-17 MED ORDER — ESOMEPRAZOLE MAGNESIUM 40 MG PO CPDR: 40.0000 mg | DELAYED_RELEASE_CAPSULE | Freq: Every day | ORAL | 3 refills | Status: DC

## 2021-06-17 NOTE — Assessment & Plan Note (Signed)
Has had 3 weeks of increased symptoms No new exposures and no illness Will have him be persistent with the montelukast---if ongoing symptoms, will start inhaled steroid Has albuterol for prn

## 2021-06-17 NOTE — Assessment & Plan Note (Signed)
Healthy Recent PSA 0.5 Colon due 2026 Exercises regularly Prefers no flu or COVID vaccines, or the second shingrix

## 2021-06-17 NOTE — Progress Notes (Signed)
Subjective:    Patient ID: Logan Wise, male    DOB: [birth year], 60 y.o.   MRN: [id number]  HPI Here for physical He is doing okay Had noticed some "low grade asthma" for 3 weeks No illness Has to use inhaler twice a day--but hasn't been taking montelukast regularly (at most every other day)  Still working out in gym---"asthma" doesn't limit him No new exposures  Has split his testosterone into 2 doses per week (same total dose)  Current Outpatient Medications on File Prior to Visit  Medication Sig Dispense Refill   albuterol (VENTOLIN HFA) 108 (90 Base) MCG/ACT inhaler INHALE TWO PUFFS BY MOUTH EVERY 6 HOURS AS NEEDED FOR WHEEZING OR SHORTNESS OF BREATH 18 g 0   amLODipine (NORVASC) 2.5 MG tablet Take 1 tablet (2.5 mg total) by mouth daily. 90 tablet 3   cetirizine (ZYRTEC) 10 MG tablet Take 10 mg by mouth daily.     clindamycin (CLEOCIN T) 1 % external solution Apply topically at bedtime. qhs to aa scalp 60 mL 3   esomeprazole (NEXIUM) 40 MG capsule Take 1 capsule (40 mg total) by mouth daily. 30 capsule 0   ketoconazole (NIZORAL) 2 % shampoo Apply 1 application topically 3 (three) times a week. Wash scalp 3 times weekly, let sit 5 minutes and rinse out 120 mL 3   montelukast (SINGULAIR) 10 MG tablet Take 1 tablet (10 mg total) by mouth at bedtime. 90 tablet 3   tadalafil (CIALIS) 20 MG tablet TAKE ONE TABLET BY MOUTH DAILY AS NEEDED FOR ERECTILE DYSFUNCTION 12 tablet 11   testosterone cypionate (DEPOTESTOSTERONE CYPIONATE) 200 MG/ML injection INJECT 1 ML INTO THE MUSCLE EVERY 7 DAYS 4 mL 5   valACYclovir (VALTREX) 1000 MG tablet Take 1 tablet (1,000 mg total) by mouth as directed. 2 po at onset of fever blisters, then 2 po 12 hours later 30 tablet 11   valsartan-hydrochlorothiazide (DIOVAN-HCT) 320-25 MG tablet TAKE 1 TABLET BY MOUTH DAILY. PLEASE KEEP UPCOMING APPOINTMENT IN DECEMBER WITH DR. ROSS BEFORE ANYMORE REFILLS. THANK YOU 30 tablet 11   No current  facility-administered medications on file prior to visit.    Allergies  Allergen Reactions   Amoxicillin-Pot Clavulanate Anaphylaxis and Hives   Peanut Oil Shortness Of Breath and Swelling   Peanut-Containing Drug Products Swelling    Past Medical History:  Diagnosis Date   Allergy    Anxiety    Arthritis    Asthma    Depression    Dysplastic nevus 05/31/2009   right periaxillary area near prox bicep   GERD (gastroesophageal reflux disease)    Hyperlipidemia    Hypothyroid 2011   Low testosterone     Past Surgical History:  Procedure Laterality Date   ANKLE RECONSTRUCTION Left 05/2015   HERNIA REPAIR  05/29/14   KNEE ARTHROSCOPY     right   NASAL STENOSIS REPAIR     ROTATOR CUFF REPAIR     left 12/07, right 3/05 ([facility])   SEPTOPLASTY      Family History  Problem Relation Age of Onset   Cancer Mother        cervical   Diabetes Father    Heart disease Father    Hypertension Father     Social History   Socioeconomic History   Marital status: Married    Spouse name: Not on file   Number of children: 3   Years of education: Not on file   Highest education level: Not on file  Occupational History   Occupation: outside Press photographer    Comment: [employer]  Tobacco Use   Smoking status: Former   Smokeless tobacco: Never  Scientific laboratory technician Use: Never used  Substance and Sexual Activity   Alcohol use: Yes    Alcohol/week: 0.0 standard drinks   Drug use: No   Sexual activity: Not on file  Other Topics Concern   Not on file  Social History Narrative   Not on file   Social Determinants of Health   Financial Resource Strain: Not on file  Food Insecurity: Not on file  Transportation Needs: Not on file  Physical Activity: Not on file  Stress: Not on file  Social Connections: Not on file  Intimate Partner Violence: Not on file   Review of Systems  Constitutional:  Negative for fatigue and unexpected weight change.       Wears seat belt  HENT:  Positive  for tinnitus. Negative for dental problem and hearing loss.        Keeps up with dentist  Eyes:  Negative for visual disturbance.       No diplopia or unilateral vision loss  Respiratory:  Positive for wheezing. Negative for cough and shortness of breath.   Cardiovascular:  Negative for chest pain, palpitations and leg swelling.  Gastrointestinal:  Negative for blood in stool and constipation.       No heartburn  Endocrine: Negative for polydipsia and polyuria.  Genitourinary:  Negative for difficulty urinating and urgency.       Satisfied with cialis  Musculoskeletal:  Negative for joint swelling.       Multiple joint problems---seeing ortho for knees/shoulders, etc  Skin:  Negative for rash.  Allergic/Immunologic: Negative for environmental allergies and immunocompromised state.  Neurological:  Negative for dizziness, syncope, light-headedness and headaches.  Hematological:  Negative for adenopathy. Does not bruise/bleed easily.  Psychiatric/Behavioral:  Negative for dysphoric mood and sleep disturbance. The patient is not nervous/anxious.       Objective:   Physical Exam Constitutional:      Appearance: Normal appearance.  HENT:     Right Ear: Tympanic membrane and ear canal normal.     Left Ear: Tympanic membrane and ear canal normal.     Mouth/Throat:     Pharynx: No oropharyngeal exudate or posterior oropharyngeal erythema.  Eyes:     Conjunctiva/sclera: Conjunctivae normal.     Pupils: Pupils are equal, round, and reactive to light.  Cardiovascular:     Rate and Rhythm: Normal rate and regular rhythm.     Pulses: Normal pulses.     Heart sounds: No murmur heard.   No gallop.  Pulmonary:     Effort: Pulmonary effort is normal.     Breath sounds: Normal breath sounds. No wheezing or rales.  Abdominal:     Palpations: Abdomen is soft.     Tenderness: There is no abdominal tenderness.  Musculoskeletal:     Cervical back: Neck supple.     Right lower leg: No edema.      Left lower leg: No edema.  Lymphadenopathy:     Cervical: No cervical adenopathy.  Skin:    Findings: No lesion or rash.  Neurological:     General: No focal deficit present.     Mental Status: He is alert and oriented to person, place, and time.  Psychiatric:        Mood and Affect: Mood normal.        Behavior: Behavior normal.  Assessment & Plan:

## 2021-06-17 NOTE — Patient Instructions (Signed)
Please take the montelukast daily (and he nexium). If your wheezing and tight feeling is not better within 2 weeks, send me an email and I will start you on an inhaled steroid like flovent.

## 2021-06-17 NOTE — Assessment & Plan Note (Signed)
BP Readings from Last 3 Encounters:  06/17/21 120/80  08/31/20 120/74  06/08/20 (!) 148/72    Controlled on the amlodipine and valsartan/HCTZ Had recent comprehensive labs---he will send me copy

## 2021-06-17 NOTE — Assessment & Plan Note (Signed)
Doing well with adderall or citalopram  No functional issues at work

## 2021-06-17 NOTE — Assessment & Plan Note (Signed)
Uses nexium prn

## 2021-06-18 NOTE — Progress Notes (Signed)
Cardiology Office Note    Date:  07/02/2021   ID:  CID AGENA, DOB 03/21/1961, MRN [id number]   PCP:  Venia Carbon, MD   [place] [facility]  Cardiologist:  Dorris Carnes, MD   Advanced Practice Provider:  No care team member to display Electrophysiologist:  None   [phone]   Chief Complaint  Patient presents with   Follow-up    History of Present Illness:  Logan Wise is a 60 y.o. male with history of HTN, HLD, Obesity. Last saw Dr. Harrington Challenger 05/2020 and BP not controlled, started on amlodipine.  Patient comes in for f/u. Has gained some weight-having ankle issues after reconstruction 4 yrs ago. Also had recent knee injections and had a lot of knee swellling after and had to have them drained.BP running a little high with constant pain since Friday. Usually 130-135/70's. Triglycerides high. Eating a lot of carbs, drinking wine nightly. Works in Interior and spatial designer for [employer].    Past Medical History:  Diagnosis Date   Allergy    Anxiety    Arthritis    Asthma    Depression    Dysplastic nevus 05/31/2009   right periaxillary area near prox bicep   GERD (gastroesophageal reflux disease)    Hyperlipidemia    Hypothyroid 2011   Low testosterone     Past Surgical History:  Procedure Laterality Date   ANKLE RECONSTRUCTION Left 05/2015   HERNIA REPAIR  05/29/14   KNEE ARTHROSCOPY     right   NASAL STENOSIS REPAIR     ROTATOR CUFF REPAIR     left 12/07, right 3/05 ([facility])   SEPTOPLASTY      Current Medications: Current Meds  Medication Sig   albuterol (VENTOLIN HFA) 108 (90 Base) MCG/ACT inhaler INHALE TWO PUFFS BY MOUTH EVERY 6 HOURS AS NEEDED FOR WHEEZING OR SHORTNESS OF BREATH   amLODipine (NORVASC) 2.5 MG tablet Take 1 tablet (2.5 mg total) by mouth daily.   cetirizine (ZYRTEC) 10 MG tablet Take 10 mg by mouth daily.   clindamycin (CLEOCIN T) 1 % external solution Apply topically at bedtime. qhs to aa scalp   esomeprazole  (NEXIUM) 40 MG capsule Take 1 capsule (40 mg total) by mouth daily.   ketoconazole (NIZORAL) 2 % shampoo Apply 1 application topically 3 (three) times a week. Wash scalp 3 times weekly, let sit 5 minutes and rinse out   montelukast (SINGULAIR) 10 MG tablet Take 1 tablet (10 mg total) by mouth at bedtime.   tadalafil (CIALIS) 20 MG tablet TAKE ONE TABLET BY MOUTH DAILY AS NEEDED FOR ERECTILE DYSFUNCTION   testosterone cypionate (DEPOTESTOSTERONE CYPIONATE) 200 MG/ML injection INJECT 1 ML INTO THE MUSCLE EVERY 7 DAYS   valACYclovir (VALTREX) 1000 MG tablet Take 1 tablet (1,000 mg total) by mouth as directed. 2 po at onset of fever blisters, then 2 po 12 hours later   valsartan-hydrochlorothiazide (DIOVAN-HCT) 320-25 MG tablet TAKE 1 TABLET BY MOUTH DAILY. PLEASE KEEP UPCOMING APPOINTMENT IN DECEMBER WITH DR. ROSS BEFORE ANYMORE REFILLS. THANK YOU     Allergies:   Amoxicillin-pot clavulanate, Peanut oil, and Peanut-containing drug products   Social History   Socioeconomic History   Marital status: Married    Spouse name: Not on file   Number of children: 3   Years of education: Not on file   Highest education level: Not on file  Occupational History   Occupation: outside Press photographer    Comment: [employer]  Tobacco Use  Smoking status: Former   Smokeless tobacco: Never  Vaping Use   Vaping Use: Never used  Substance and Sexual Activity   Alcohol use: Yes    Alcohol/week: 0.0 standard drinks   Drug use: No   Sexual activity: Not on file  Other Topics Concern   Not on file  Social History Narrative   Not on file   Social Determinants of Health   Financial Resource Strain: Not on file  Food Insecurity: Not on file  Transportation Needs: Not on file  Physical Activity: Not on file  Stress: Not on file  Social Connections: Not on file     Family History:  The patient's  family history includes Cancer in his mother; Diabetes in his father; Heart disease in his father; Hypertension in  his father.   ROS:   Please see the history of present illness.    ROS All other systems reviewed and are negative.   PHYSICAL EXAM:   VS:  BP (!) 146/84 (BP Location: Right Arm, Patient Position: Sitting, Cuff Size: Large)    Pulse (!) 58    Ht 6' (1.829 m)    Wt 234 lb (106.1 kg)    SpO2 99%    BMI 31.74 kg/m   Physical Exam  GEN: Well nourished, well developed, in no acute distress  Neck: no JVD, carotid bruits, or masses Cardiac:RRR; no murmurs, rubs, or gallops  Respiratory:  clear to auscultation bilaterally, normal work of breathing GI: soft, nontender, nondistended, + BS Ext: without cyanosis, clubbing, or edema, Good distal pulses bilaterally Neuro:  Alert and Oriented x 3, Psych: euthymic mood, full affect  Wt Readings from Last 3 Encounters:  07/02/21 234 lb (106.1 kg)  06/17/21 229 lb (103.9 kg)  03/20/21 235 lb (106.6 kg)      Studies/Labs Reviewed:   EKG:  EKG is  ordered today.  The ekg ordered today demonstrates NSR with poor R wave progression ant. No change.  Recent Labs: No results found for requested labs within last 8760 hours.   Lipid Panel    Component Value Date/Time   CHOL 205 (H) 06/08/2020 1232   TRIG 189 (H) 06/08/2020 1232   HDL 48 06/08/2020 1232   CHOLHDL 4.3 06/08/2020 1232   CHOLHDL 4 12/08/2012 0753   VLDL 25.0 12/08/2012 0753   LDLCALC 124 (H) 06/08/2020 1232    Additional studies/ records that were reviewed today include:  Echo 2013 Study Conclusions   Left ventricle: The cavity size was normal. Wall thickness  was increased in a pattern of mild LVH. Systolic function  was normal. The estimated ejection fraction was in the range  of 55% to 60%. Wall motion was normal; there were no  regional wall motion abnormalities.  Transthoracic echocardiography.  M-mode, complete 2D,  spectral Doppler, and color Doppler.  Height:  Height:  182.9cm. Height: 72in.  Weight:  Weight: 96.2kg. Weight:  211.6lb.  Body mass index:  BMI:  28.8kg/m^2.  Body surface  area:    BSA: 2.53m^2.  Blood pressure:     118/72.  Patient  status:  Outpatient.  Location:  [facility]   ------------------------------------------------------------   ------------------------------------------------------------  Left ventricle:  The cavity size was normal. Wall thickness  was increased in a pattern of mild LVH. Systolic function  was normal. The estimated ejection fraction was in the range  of 55% to 60%. Wall motion was normal; there were no  regional wall motion abnormalities.    Risk Assessment/Calculations:  ASSESSMENT:    1. Essential hypertension   2. Mixed hyperlipidemia   3. Class 1 obesity due to excess calories with serious comorbidity and body mass index (BMI) of 31.0 to 31.9 in adult      PLAN:  In order of problems listed above:  HTN BP controlled at home. Running a little high here but having a lot of orthopedic pain.  Continue amlodipine and Diovan HCTZ  HLD-LDL and triglycerides have been running high.  We will check labs today.  He is also interested in coronary calcium score which I will order.  Obesity-unable to exercise due to knee/ankle issues. Mediterranean diet discussed   Shared Decision Making/Informed Consent        Medication Adjustments/Labs and Tests Ordered: Current medicines are reviewed at length with the patient today.  Concerns regarding medicines are outlined above.  Medication changes, Labs and Tests ordered today are listed in the Patient Instructions below. Patient Instructions  Medication Instructions:  Your physician recommends that you continue on your current medications as directed. Please refer to the Current Medication list given to you today.  *If you need a refill on your cardiac medications before your next appointment, please call your pharmacy*   Lab Work: TODAY: LIPIDS, TSH, CMET, CBC If you have labs (blood work) drawn today and your tests are  completely normal, you will receive your results only by: [place] (if you have MyChart) OR A paper copy in the mail If you have any lab test that is abnormal or we need to change your treatment, we will call you to review the results.  Testing/Procedures: YOUR PHYSICIAN RECOMMENDS THAT YOU HAVE CALCIUM SCORE. THIS TEST IS SELF PAY  At [facility], you and your health needs are our priority.  As part of our continuing mission to provide you with exceptional heart care, we have created designated Provider Care Teams.  These Care Teams include your primary Cardiologist (physician) and Advanced Practice Providers (APPs -  Physician Assistants and Nurse Practitioners) who all work together to provide you with the care you need, when you need it.  We recommend signing up for the patient portal called "MyChart".  Sign up information is provided on this After Visit Summary.  MyChart is used to connect with patients for Virtual Visits (Telemedicine).  Patients are able to view lab/test results, encounter notes, upcoming appointments, etc.  Non-urgent messages can be sent to your provider as well.   To learn more about what you can do with MyChart, go to [email or web address].    Your next appointment:   1 year(s)  The format for your next appointment:   In Person  Provider:   Dorris Carnes, MD   Other Instructions Mediterranean Diet A Mediterranean diet refers to food and lifestyle choices that are based on the traditions of countries located on the [place]. It focuses on eating more fruits, vegetables, whole grains, beans, nuts, seeds, and heart-healthy fats, and eating less dairy, meat, eggs, and processed foods with added sugar, salt, and fat. This way of eating has been shown to help prevent certain conditions and improve outcomes for people who have chronic diseases, like kidney disease and heart disease. What are tips for following this plan? Reading food labels Check the  serving size of packaged foods. For foods such as rice and pasta, the serving size refers to the amount of cooked product, not dry. Check the total fat in packaged foods. Avoid foods that have saturated fat or trans fats.  Check the ingredient list for added sugars, such as corn syrup. Shopping  Buy a variety of foods that offer a balanced diet, including: Fresh fruits and vegetables (produce). Grains, beans, nuts, and seeds. Some of these may be available in unpackaged forms or large amounts (in bulk). Fresh seafood. Poultry and eggs. Low-fat dairy products. Buy whole ingredients instead of prepackaged foods. Buy fresh fruits and vegetables in-season from local farmers markets. Buy plain frozen fruits and vegetables. If you do not have access to quality fresh seafood, buy precooked frozen shrimp or canned fish, such as tuna, salmon, or sardines. Stock your pantry so you always have certain foods on hand, such as olive oil, canned tuna, canned tomatoes, rice, pasta, and beans. Cooking Cook foods with extra-virgin olive oil instead of using butter or other vegetable oils. Have meat as a side dish, and have vegetables or grains as your main dish. This means having meat in small portions or adding small amounts of meat to foods like pasta or stew. Use beans or vegetables instead of meat in common dishes like chili or lasagna. Experiment with different cooking methods. Try roasting, broiling, steaming, and sauting vegetables. Add frozen vegetables to soups, stews, pasta, or rice. Add nuts or seeds for added healthy fats and plant protein at each meal. You can add these to yogurt, salads, or vegetable dishes. Marinate fish or vegetables using olive oil, lemon juice, garlic, and fresh herbs. Meal planning Plan to eat one vegetarian meal one day each week. Try to work up to two vegetarian meals, if possible. Eat seafood two or more times a week. Have healthy snacks readily available, such  as: Vegetable sticks with hummus. Greek yogurt. Fruit and nut trail mix. Eat balanced meals throughout the week. This includes: Fruit: 2-3 servings a day. Vegetables: 4-5 servings a day. Low-fat dairy: 2 servings a day. Fish, poultry, or lean meat: 1 serving a day. Beans and legumes: 2 or more servings a week. Nuts and seeds: 1-2 servings a day. Whole grains: 6-8 servings a day. Extra-virgin olive oil: 3-4 servings a day. Limit red meat and sweets to only a few servings a month. Lifestyle  Cook and eat meals together with your family, when possible. Drink enough fluid to keep your urine pale yellow. Be physically active every day. This includes: Aerobic exercise like running or swimming. Leisure activities like gardening, walking, or housework. Get 7-8 hours of sleep each night. If recommended by your health care provider, drink red wine in moderation. This means 1 glass a day for nonpregnant women and 2 glasses a day for men. A glass of wine equals 5 oz (150 mL). What foods should I eat? Fruits Apples. Apricots. Avocado. Berries. Bananas. Cherries. Dates. Figs. Grapes. Lemons. Melon. Oranges. Peaches. Plums. Pomegranate. Vegetables Artichokes. Beets. Broccoli. Cabbage. Carrots. Eggplant. Green beans. Chard. Kale. Spinach. Onions. Leeks. Peas. Squash. Tomatoes. Peppers. Radishes. Grains Whole-grain pasta. Brown rice. Bulgur wheat. Polenta. Couscous. Whole-wheat bread. [place]. Meats and other proteins Beans. Almonds. Sunflower seeds. Pine nuts. Peanuts. [place]. Salmon. Scallops. Shrimp. [place]. Tilapia. Clams. Oysters. Eggs. Poultry without skin. Dairy Low-fat milk. Cheese. Greek yogurt. Fats and oils Extra-virgin olive oil. Avocado oil. Grapeseed oil. Beverages Water. Red wine. Herbal tea. Sweets and desserts Greek yogurt with honey. Baked apples. Poached pears. Trail mix. Seasonings and condiments Basil. Cilantro. Coriander. Cumin. Mint. Parsley. Sage. Rosemary.  Tarragon. Garlic. Oregano. Thyme. Pepper. Balsamic vinegar. Tahini. Hummus. Tomato sauce. Olives. Mushrooms. The items listed above may not be a complete list  of foods and beverages you can eat. Contact a dietitian for more information. What foods should I limit? This is a list of foods that should be eaten rarely or only on special occasions. Fruits Fruit canned in syrup. Vegetables Deep-fried potatoes (french fries). Grains Prepackaged pasta or rice dishes. Prepackaged cereal with added sugar. Prepackaged snacks with added sugar. Meats and other proteins Beef. Pork. Lamb. Poultry with skin. Hot dogs. Berniece Salines. Dairy Ice cream. Sour cream. Whole milk. Fats and oils Butter. Canola oil. Vegetable oil. Beef fat (tallow). Lard. Beverages Juice. Sugar-sweetened soft drinks. Beer. Liquor and spirits. Sweets and desserts Cookies. Cakes. Pies. Candy. Seasonings and condiments Mayonnaise. Pre-made sauces and marinades. The items listed above may not be a complete list of foods and beverages you should limit. Contact a dietitian for more information. Summary The Mediterranean diet includes both food and lifestyle choices. Eat a variety of fresh fruits and vegetables, beans, nuts, seeds, and whole grains. Limit the amount of red meat and sweets that you eat. If recommended by your health care provider, drink red wine in moderation. This means 1 glass a day for nonpregnant women and 2 glasses a day for men. A glass of wine equals 5 oz (150 mL). This information is not intended to replace advice given to you by your health care provider. Make sure you discuss any questions you have with your health care provider. Document Revised: 07/22/2019 Document Reviewed: 05/19/2019 Elsevier Patient Education  2022 [place], Ermalinda Barrios, [place]  07/02/2021 11:09 AM    [facility] [address], [place], [place]  [address] Phone: [phone]; Fax: [phone]

## 2021-07-02 ENCOUNTER — Encounter: Payer: Self-pay | Admitting: Physician Assistant

## 2021-07-02 ENCOUNTER — Other Ambulatory Visit: Payer: Self-pay

## 2021-07-02 ENCOUNTER — Ambulatory Visit: Payer: 59 | Admitting: Physician Assistant

## 2021-07-02 VITALS — BP 146/84 | HR 58 | Ht 72.0 in | Wt 234.0 lb

## 2021-07-02 DIAGNOSIS — E782 Mixed hyperlipidemia: Secondary | ICD-10-CM

## 2021-07-02 DIAGNOSIS — I10 Essential (primary) hypertension: Secondary | ICD-10-CM | POA: Diagnosis not present

## 2021-07-02 DIAGNOSIS — E6609 Other obesity due to excess calories: Secondary | ICD-10-CM

## 2021-07-02 DIAGNOSIS — Z6831 Body mass index (BMI) 31.0-31.9, adult: Secondary | ICD-10-CM

## 2021-07-02 LAB — COMPREHENSIVE METABOLIC PANEL
ALT: 25 IU/L (ref 0–44)
AST: 21 IU/L (ref 0–40)
Albumin/Globulin Ratio: 2.4 — ABNORMAL HIGH (ref 1.2–2.2)
Albumin: 4.6 g/dL (ref 3.8–4.9)
Alkaline Phosphatase: 57 IU/L (ref 44–121)
BUN/Creatinine Ratio: 21 (ref 10–24)
BUN: 21 mg/dL (ref 8–27)
Bilirubin Total: 0.8 mg/dL (ref 0.0–1.2)
CO2: 27 mmol/L (ref 20–29)
Calcium: 9.4 mg/dL (ref 8.6–10.2)
Chloride: 92 mmol/L — ABNORMAL LOW (ref 96–106)
Creatinine, Ser: 1.02 mg/dL (ref 0.76–1.27)
Globulin, Total: 1.9 g/dL (ref 1.5–4.5)
Glucose: 86 mg/dL (ref 70–99)
Potassium: 4.1 mmol/L (ref 3.5–5.2)
Sodium: 131 mmol/L — ABNORMAL LOW (ref 134–144)
Total Protein: 6.5 g/dL (ref 6.0–8.5)
eGFR: 84 mL/min/{1.73_m2} (ref >59)

## 2021-07-02 LAB — CBC
Hematocrit: 43.7 % (ref 37.5–51.0)
Hemoglobin: 15.4 g/dL (ref 13.0–17.7)
MCH: 33 pg (ref 26.6–33.0)
MCHC: 35.2 g/dL (ref 31.5–35.7)
MCV: 94 fL (ref 79–97)
Platelets: 189 10*3/uL (ref 150–450)
RBC: 4.67 x10E6/uL (ref 4.14–5.80)
RDW: 11.9 % (ref 11.6–15.4)
WBC: 5.2 10*3/uL (ref 3.4–10.8)

## 2021-07-02 LAB — LIPID PANEL
Chol/HDL Ratio: 3.5 ratio (ref 0.0–5.0)
Cholesterol, Total: 211 mg/dL — ABNORMAL HIGH (ref 100–199)
HDL: 60 mg/dL (ref >39)
LDL Chol Calc (NIH): 132 mg/dL — ABNORMAL HIGH (ref 0–99)
Triglycerides: 107 mg/dL (ref 0–149)
VLDL Cholesterol Cal: 19 mg/dL (ref 5–40)

## 2021-07-02 LAB — TSH: TSH: 2.14 u[IU]/mL (ref 0.450–4.500)

## 2021-07-02 NOTE — Addendum Note (Signed)
Addended by: Jacinta Shoe on: 07/02/2021 11:18 AM   Modules accepted: Orders

## 2021-07-02 NOTE — Patient Instructions (Addendum)
Medication Instructions:  Your physician recommends that you continue on your current medications as directed. Please refer to the Current Medication list given to you today.  *If you need a refill on your cardiac medications before your next appointment, please call your pharmacy*   Lab Work: TODAY: LIPIDS, TSH, CMET, CBC If you have labs (blood work) drawn today and your tests are completely normal, you will receive your results only by: [place] (if you have MyChart) OR A paper copy in the mail If you have any lab test that is abnormal or we need to change your treatment, we will call you to review the results.  Testing/Procedures: YOUR PHYSICIAN RECOMMENDS THAT YOU HAVE CALCIUM SCORE. THIS TEST IS SELF PAY  At [facility], you and your health needs are our priority.  As part of our continuing mission to provide you with exceptional heart care, we have created designated Provider Care Teams.  These Care Teams include your primary Cardiologist (physician) and Advanced Practice Providers (APPs -  Physician Assistants and Nurse Practitioners) who all work together to provide you with the care you need, when you need it.  We recommend signing up for the patient portal called "MyChart".  Sign up information is provided on this After Visit Summary.  MyChart is used to connect with patients for Virtual Visits (Telemedicine).  Patients are able to view lab/test results, encounter notes, upcoming appointments, etc.  Non-urgent messages can be sent to your provider as well.   To learn more about what you can do with MyChart, go to [email or web address].    Your next appointment:   1 year(s)  The format for your next appointment:   In Person  Provider:   Dorris Carnes, MD   Other Instructions Mediterranean Diet A Mediterranean diet refers to food and lifestyle choices that are based on the traditions of countries located on the [place]. It focuses on eating more fruits,  vegetables, whole grains, beans, nuts, seeds, and heart-healthy fats, and eating less dairy, meat, eggs, and processed foods with added sugar, salt, and fat. This way of eating has been shown to help prevent certain conditions and improve outcomes for people who have chronic diseases, like kidney disease and heart disease. What are tips for following this plan? Reading food labels Check the serving size of packaged foods. For foods such as rice and pasta, the serving size refers to the amount of cooked product, not dry. Check the total fat in packaged foods. Avoid foods that have saturated fat or trans fats. Check the ingredient list for added sugars, such as corn syrup. Shopping  Buy a variety of foods that offer a balanced diet, including: Fresh fruits and vegetables (produce). Grains, beans, nuts, and seeds. Some of these may be available in unpackaged forms or large amounts (in bulk). Fresh seafood. Poultry and eggs. Low-fat dairy products. Buy whole ingredients instead of prepackaged foods. Buy fresh fruits and vegetables in-season from local farmers markets. Buy plain frozen fruits and vegetables. If you do not have access to quality fresh seafood, buy precooked frozen shrimp or canned fish, such as tuna, salmon, or sardines. Stock your pantry so you always have certain foods on hand, such as olive oil, canned tuna, canned tomatoes, rice, pasta, and beans. Cooking Cook foods with extra-virgin olive oil instead of using butter or other vegetable oils. Have meat as a side dish, and have vegetables or grains as your main dish. This means having meat in small portions or adding small  amounts of meat to foods like pasta or stew. Use beans or vegetables instead of meat in common dishes like chili or lasagna. Experiment with different cooking methods. Try roasting, broiling, steaming, and sauting vegetables. Add frozen vegetables to soups, stews, pasta, or rice. Add nuts or seeds for added  healthy fats and plant protein at each meal. You can add these to yogurt, salads, or vegetable dishes. Marinate fish or vegetables using olive oil, lemon juice, garlic, and fresh herbs. Meal planning Plan to eat one vegetarian meal one day each week. Try to work up to two vegetarian meals, if possible. Eat seafood two or more times a week. Have healthy snacks readily available, such as: Vegetable sticks with hummus. Greek yogurt. Fruit and nut trail mix. Eat balanced meals throughout the week. This includes: Fruit: 2-3 servings a day. Vegetables: 4-5 servings a day. Low-fat dairy: 2 servings a day. Fish, poultry, or lean meat: 1 serving a day. Beans and legumes: 2 or more servings a week. Nuts and seeds: 1-2 servings a day. Whole grains: 6-8 servings a day. Extra-virgin olive oil: 3-4 servings a day. Limit red meat and sweets to only a few servings a month. Lifestyle  Cook and eat meals together with your family, when possible. Drink enough fluid to keep your urine pale yellow. Be physically active every day. This includes: Aerobic exercise like running or swimming. Leisure activities like gardening, walking, or housework. Get 7-8 hours of sleep each night. If recommended by your health care provider, drink red wine in moderation. This means 1 glass a day for nonpregnant women and 2 glasses a day for men. A glass of wine equals 5 oz (150 mL). What foods should I eat? Fruits Apples. Apricots. Avocado. Berries. Bananas. Cherries. Dates. Figs. Grapes. Lemons. Melon. Oranges. Peaches. Plums. Pomegranate. Vegetables Artichokes. Beets. Broccoli. Cabbage. Carrots. Eggplant. Green beans. Chard. Kale. Spinach. Onions. Leeks. Peas. Squash. Tomatoes. Peppers. Radishes. Grains Whole-grain pasta. Brown rice. Bulgur wheat. Polenta. Couscous. Whole-wheat bread. [place]. Meats and other proteins Beans. Almonds. Sunflower seeds. Pine nuts. Peanuts. [place]. Salmon. Scallops. Shrimp. [place].  Tilapia. Clams. Oysters. Eggs. Poultry without skin. Dairy Low-fat milk. Cheese. Greek yogurt. Fats and oils Extra-virgin olive oil. Avocado oil. Grapeseed oil. Beverages Water. Red wine. Herbal tea. Sweets and desserts Greek yogurt with honey. Baked apples. Poached pears. Trail mix. Seasonings and condiments Basil. Cilantro. Coriander. Cumin. Mint. Parsley. Sage. Rosemary. Tarragon. Garlic. Oregano. Thyme. Pepper. Balsamic vinegar. Tahini. Hummus. Tomato sauce. Olives. Mushrooms. The items listed above may not be a complete list of foods and beverages you can eat. Contact a dietitian for more information. What foods should I limit? This is a list of foods that should be eaten rarely or only on special occasions. Fruits Fruit canned in syrup. Vegetables Deep-fried potatoes (french fries). Grains Prepackaged pasta or rice dishes. Prepackaged cereal with added sugar. Prepackaged snacks with added sugar. Meats and other proteins Beef. Pork. Lamb. Poultry with skin. Hot dogs. Berniece Salines. Dairy Ice cream. Sour cream. Whole milk. Fats and oils Butter. Canola oil. Vegetable oil. Beef fat (tallow). Lard. Beverages Juice. Sugar-sweetened soft drinks. Beer. Liquor and spirits. Sweets and desserts Cookies. Cakes. Pies. Candy. Seasonings and condiments Mayonnaise. Pre-made sauces and marinades. The items listed above may not be a complete list of foods and beverages you should limit. Contact a dietitian for more information. Summary The Mediterranean diet includes both food and lifestyle choices. Eat a variety of fresh fruits and vegetables, beans, nuts, seeds, and whole grains. Limit the  amount of red meat and sweets that you eat. If recommended by your health care provider, drink red wine in moderation. This means 1 glass a day for nonpregnant women and 2 glasses a day for men. A glass of wine equals 5 oz (150 mL). This information is not intended to replace advice given to you by your health  care provider. Make sure you discuss any questions you have with your health care provider. Document Revised: 07/22/2019 Document Reviewed: 05/19/2019 Elsevier Patient Education  2022 [employer].

## 2021-08-07 ENCOUNTER — Ambulatory Visit (INDEPENDENT_AMBULATORY_CARE_PROVIDER_SITE_OTHER)
Admission: RE | Admit: 2021-08-07 | Discharge: 2021-08-07 | Disposition: A | Discharge status: Home / Self Care | Payer: Self-pay | Source: Ambulatory Visit | Attending: Physician Assistant | Admitting: Physician Assistant

## 2021-08-07 ENCOUNTER — Other Ambulatory Visit: Payer: Self-pay

## 2021-08-07 DIAGNOSIS — E782 Mixed hyperlipidemia: Secondary | ICD-10-CM

## 2021-08-08 ENCOUNTER — Telehealth: Payer: Self-pay | Admitting: Physician Assistant

## 2021-08-08 DIAGNOSIS — R9389 Abnormal findings on diagnostic imaging of other specified body structures: Secondary | ICD-10-CM

## 2021-08-08 DIAGNOSIS — E6609 Other obesity due to excess calories: Secondary | ICD-10-CM

## 2021-08-08 DIAGNOSIS — E782 Mixed hyperlipidemia: Secondary | ICD-10-CM

## 2021-08-08 DIAGNOSIS — Z6831 Body mass index (BMI) 31.0-31.9, adult: Secondary | ICD-10-CM

## 2021-08-08 DIAGNOSIS — Z79899 Other long term (current) drug therapy: Secondary | ICD-10-CM

## 2021-08-08 NOTE — Telephone Encounter (Signed)
Patient returning call for CT results. 

## 2021-08-12 ENCOUNTER — Other Ambulatory Visit: Payer: Self-pay | Admitting: *Deleted

## 2021-08-12 DIAGNOSIS — E782 Mixed hyperlipidemia: Secondary | ICD-10-CM

## 2021-08-12 DIAGNOSIS — I779 Disorder of arteries and arterioles, unspecified: Secondary | ICD-10-CM

## 2021-08-12 MED ORDER — ROSUVASTATIN CALCIUM 10 MG PO TABS: 10.0000 mg | ORAL_TABLET | Freq: Every day | ORAL | 3 refills | Status: DC

## 2021-08-12 NOTE — Progress Notes (Unsigned)
Pt aware of recommendations  crestor and echo ordered Per pt is not having any symptoms did not feel he needed to have  myoview Will let Dr Harrington Challenger know ./cy    Pt seen by Gerrianne Scale recently   CT ordered Calcium score CT    Evidence of CAD    Score is 952  (96th %ile)   When he was seen by Gerrianne Scale he was asymptomatic REcomm:   Stress myoview (Lexiscan if cant walk) to r/o inducible ischemia EcASA 81 mg  SHould be on a statin   Rec: Crestor 10 mg    F/U lipomed in 8 wks with Lpa and ApoB and Liver panel.   I am not able to speak on phone with pt this week    IF he would like to review before stress test I can call him next week    Leave a msg if he wants that

## 2021-08-12 NOTE — Telephone Encounter (Signed)
Pt aware of recommendations crestor and echo ordered Per pt not having any symptoms and myoview was not scheduled  Will let Dr Harrington Challenger know .Adonis Housekeeper

## 2021-08-12 NOTE — Telephone Encounter (Signed)
Patient following back up on results

## 2021-08-13 MED ORDER — ASPIRIN EC 81 MG PO TBEC: 81.0000 mg | DELAYED_RELEASE_TABLET | Freq: Every day | ORAL | 3 refills | Status: DC

## 2021-08-13 NOTE — Telephone Encounter (Signed)
Spoke with the pt in depth re: his calcium score and Dr. Harrington Challenger' recommendations to have a stress test and he agreed to proceed...   He will like to have rhe same day as his Echo.   Will send all of this information to his My Chart for his review.   Pt verbalized understanding of this stress test instructions.   [facility] due to bad knees but he can walk swiftly.   He prefers to walk on the treadmill rather than Lexiscan.   Will place the order.

## 2021-08-14 ENCOUNTER — Other Ambulatory Visit: Payer: Self-pay | Admitting: Internal Medicine

## 2021-08-28 ENCOUNTER — Other Ambulatory Visit: Payer: Self-pay | Admitting: Internal Medicine

## 2021-09-03 ENCOUNTER — Telehealth (HOSPITAL_COMMUNITY): Payer: Self-pay | Admitting: *Deleted

## 2021-09-03 NOTE — Telephone Encounter (Signed)
Left message on voicemail per DPR in reference to upcoming appointment scheduled on 09/10/2021 at 10:15 with detailed instructions given per Myocardial Perfusion Study Information Sheet for the test. LM to arrive 15 minutes early, and that it is imperative to arrive on time for appointment to keep from having the test rescheduled. If you need to cancel or reschedule your appointment, please call the office within 24 hours of your appointment. Failure to do so may result in a cancellation of your appointment, and a $50 no show fee. Phone number given for call back for any questions.  ? ?

## 2021-09-10 ENCOUNTER — Encounter (HOSPITAL_COMMUNITY): Payer: 59

## 2021-09-10 ENCOUNTER — Other Ambulatory Visit (HOSPITAL_COMMUNITY): Payer: 59

## 2021-09-25 ENCOUNTER — Encounter (HOSPITAL_COMMUNITY): Payer: Self-pay | Admitting: *Deleted

## 2021-09-25 ENCOUNTER — Telehealth (HOSPITAL_COMMUNITY): Payer: Self-pay | Admitting: *Deleted

## 2021-09-25 NOTE — Telephone Encounter (Signed)
Left message on voicemail per DPR in reference to upcoming appointment scheduled on  10/02/21 with detailed instructions given per Myocardial Perfusion Study Information Sheet for the test. LM to arrive 15 minutes early, and that it is imperative to arrive on time for appointment to keep from having the test rescheduled. If you need to cancel or reschedule your appointment, please call the office within 24 hours of your appointment. Failure to do so may result in a cancellation of your appointment, and a $50 no show fee. Phone number given for call back for any questions. Kirstie Peri ? ? ?

## 2021-09-27 ENCOUNTER — Other Ambulatory Visit: Payer: Self-pay | Admitting: Orthopedic Surgery

## 2021-09-27 DIAGNOSIS — M25572 Pain in left ankle and joints of left foot: Secondary | ICD-10-CM

## 2021-10-02 ENCOUNTER — Other Ambulatory Visit (HOSPITAL_COMMUNITY): Payer: 59

## 2021-10-02 ENCOUNTER — Encounter (HOSPITAL_COMMUNITY): Payer: 59

## 2021-10-09 ENCOUNTER — Other Ambulatory Visit: Payer: 59 | Admitting: *Deleted

## 2021-10-09 ENCOUNTER — Ambulatory Visit
Admission: RE | Admit: 2021-10-09 | Discharge: 2021-10-09 | Disposition: A | Discharge status: Home / Self Care | Payer: 59 | Source: Ambulatory Visit | Attending: Orthopedic Surgery | Admitting: Orthopedic Surgery

## 2021-10-09 DIAGNOSIS — E782 Mixed hyperlipidemia: Secondary | ICD-10-CM

## 2021-10-09 DIAGNOSIS — M25572 Pain in left ankle and joints of left foot: Secondary | ICD-10-CM

## 2021-10-10 ENCOUNTER — Other Ambulatory Visit: Payer: 59

## 2021-10-11 LAB — NMR, LIPOPROFILE
Cholesterol, Total: 170 mg/dL (ref 100–199)
HDL Particle Number: 41.9 umol/L (ref >=30.5)
HDL-C: 55 mg/dL (ref >39)
LDL Particle Number: 1243 nmol/L — ABNORMAL HIGH (ref <1000)
LDL Size: 20.7 nm (ref >20.5)
LDL-C (NIH Calc): 93 mg/dL (ref 0–99)
LP-IR Score: 76 — ABNORMAL HIGH (ref <=45)
Small LDL Particle Number: 562 nmol/L — ABNORMAL HIGH (ref <=527)
Triglycerides: 127 mg/dL (ref 0–149)

## 2021-10-11 LAB — APOLIPOPROTEIN B: Apolipoprotein B: 84 mg/dL (ref <90)

## 2021-10-11 LAB — LIPOPROTEIN A (LPA): Lipoprotein (a): 35.7 nmol/L (ref <75.0)

## 2021-10-14 ENCOUNTER — Telehealth: Payer: Self-pay

## 2021-10-14 DIAGNOSIS — Z79899 Other long term (current) drug therapy: Secondary | ICD-10-CM

## 2021-10-14 DIAGNOSIS — E782 Mixed hyperlipidemia: Secondary | ICD-10-CM

## 2021-10-14 MED ORDER — ROSUVASTATIN CALCIUM 20 MG PO TABS: 20.0000 mg | ORAL_TABLET | Freq: Every day | ORAL | 3 refills | Status: DC

## 2021-10-14 NOTE — Telephone Encounter (Signed)
-----   Message from Fay Records, MD sent at 10/13/2021  6:56 PM EDT ----- ?LDL is 93   With CAD on CT scan it should be at least below 70 ?IREcommend:  Increase Crestor to 20 mg    ?F?U lipomed in 12 wks ?Keep watching diet  Low carbs, low sugar   Lots of veggies ?

## 2021-10-14 NOTE — Telephone Encounter (Signed)
Message sent to the pt RE: his recent labwork.  Need lab date for 12 weeks.  ?

## 2021-10-29 ENCOUNTER — Telehealth (HOSPITAL_COMMUNITY): Payer: Self-pay | Admitting: *Deleted

## 2021-10-29 NOTE — Telephone Encounter (Signed)
Left message on voicemail per DPR in reference to upcoming appointment scheduled on 11/04/2021 at 8:00 with detailed instructions given per Myocardial Perfusion Study Information Sheet for the test. LM to arrive 15 minutes early, and that it is imperative to arrive on time for appointment to keep from having the test rescheduled. If you need to cancel or reschedule your appointment, please call the office within 24 hours of your appointment. Failure to do so may result in a cancellation of your appointment, and a $50 no show fee. Phone number given for call back for any questions.  ? ?

## 2021-10-30 NOTE — Telephone Encounter (Signed)
Followed up with the pt and he has increased his Crestor to 20 mg and labs to have repeat fasting labs 02/04/22.  ?

## 2021-11-04 ENCOUNTER — Ambulatory Visit (HOSPITAL_COMMUNITY): Payer: 59 | Attending: Cardiology

## 2021-11-04 ENCOUNTER — Ambulatory Visit (HOSPITAL_BASED_OUTPATIENT_CLINIC_OR_DEPARTMENT_OTHER): Payer: 59

## 2021-11-04 VITALS — Ht 72.0 in | Wt 234.0 lb

## 2021-11-04 DIAGNOSIS — Z79899 Other long term (current) drug therapy: Secondary | ICD-10-CM | POA: Insufficient documentation

## 2021-11-04 DIAGNOSIS — Z6831 Body mass index (BMI) 31.0-31.9, adult: Secondary | ICD-10-CM | POA: Diagnosis present

## 2021-11-04 DIAGNOSIS — E6609 Other obesity due to excess calories: Secondary | ICD-10-CM | POA: Insufficient documentation

## 2021-11-04 DIAGNOSIS — I10 Essential (primary) hypertension: Secondary | ICD-10-CM | POA: Insufficient documentation

## 2021-11-04 DIAGNOSIS — E782 Mixed hyperlipidemia: Secondary | ICD-10-CM | POA: Insufficient documentation

## 2021-11-04 DIAGNOSIS — I2510 Atherosclerotic heart disease of native coronary artery without angina pectoris: Secondary | ICD-10-CM

## 2021-11-04 DIAGNOSIS — I779 Disorder of arteries and arterioles, unspecified: Secondary | ICD-10-CM | POA: Insufficient documentation

## 2021-11-04 LAB — MYOCARDIAL PERFUSION IMAGING
Angina Index: 0
Base ST Depression (mm): 0 mm
Duke Treadmill Score: 10
Estimated workload: 10.9
Exercise duration (min): 9 min
Exercise duration (sec): 30 s
LV dias vol: 144 mL (ref 62–150)
LV sys vol: 57 mL
MPHR: 160 {beats}/min
Nuc Stress EF: 61 %
Peak HR: 146 {beats}/min
Percent HR: 91 %
Rest HR: 56 {beats}/min
Rest Nuclear Isotope Dose: 10.6 mCi
SDS: 0
SRS: 0
SSS: 0
ST Depression (mm): 0 mm
Stress Nuclear Isotope Dose: 31.3 mCi
TID: 0.87

## 2021-11-04 LAB — ECHOCARDIOGRAM COMPLETE
Area-P 1/2: 3.6 cm2
Height: 72 in
S' Lateral: 3.4 cm
Weight: 3744 oz

## 2021-11-04 MED ORDER — TECHNETIUM TC 99M TETROFOSMIN IV KIT
10.6000 | PACK | Freq: Once | INTRAVENOUS | Status: AC | PRN
  Administered 2021-11-04: 10.6 via INTRAVENOUS
  Filled 2021-11-04: qty 11

## 2021-11-04 MED ORDER — TECHNETIUM TC 99M TETROFOSMIN IV KIT
31.3000 | PACK | Freq: Once | INTRAVENOUS | Status: AC | PRN
  Administered 2021-11-04: 31.3 via INTRAVENOUS
  Filled 2021-11-04: qty 32

## 2021-12-07 IMAGING — MR MR ANKLE*L* W/O CM
6 series · 40 of 40 positions shown · non-contrast
Comparison: Previous MRI 02/21/2015. No relevant radiographs
available.

CLINICAL DATA: Constant ankle pain. History of reconstructive
surgery on ankle x2.

EXAM:
MRI OF THE LEFT ANKLE WITHOUT CONTRAST
TECHNIQUE: Multiplanar, multisequence MR imaging of the ankle was performed. No
intravenous contrast was administered.

[Series 3: PD fat-sat · axial · 3.0mm · 0.70mm/px · z∈[-118,+27]mm · 7 of 45 slices shown]
[im 1/45]
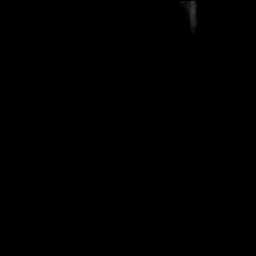
[im 8/45]
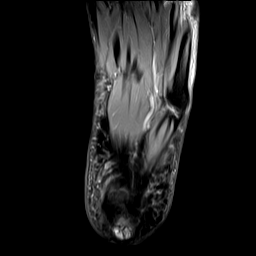
[im 15/45]
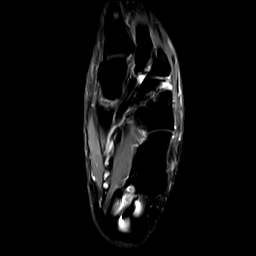
[im 23/45]
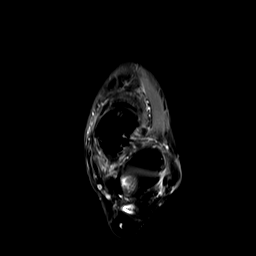
[im 30/45]
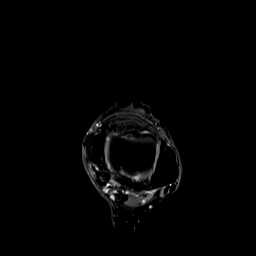
[im 37/45]
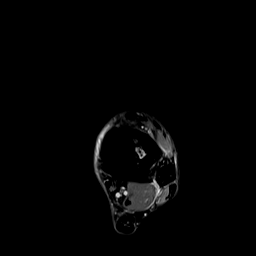
[im 45/45]
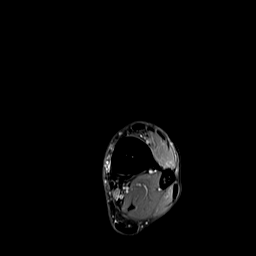

[Series 4: T2 fat-sat · axial · 3.0mm · 0.70mm/px · z∈[-118,+27]mm · 7 of 45 slices shown]
[im 1/45]
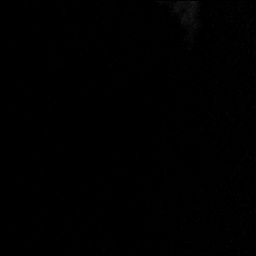
[im 8/45]
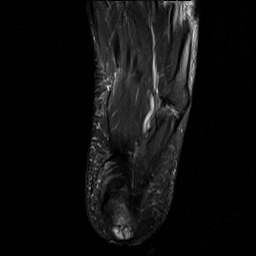
[im 15/45]
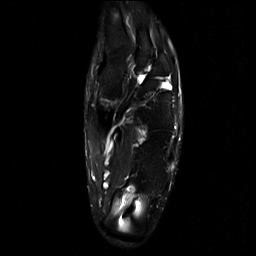
[im 23/45]
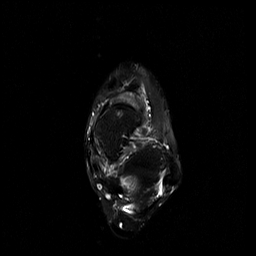
[im 30/45]
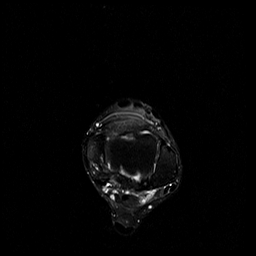
[im 37/45]
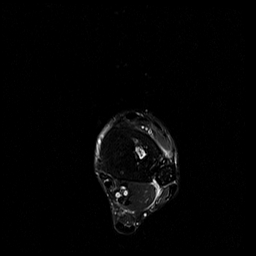
[im 45/45]
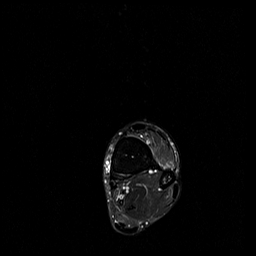

[Series 5: STIR · coronal · 3.0mm · 0.70mm/px · 8 of 44 slices shown (1 of 3)]
[im 1/44]
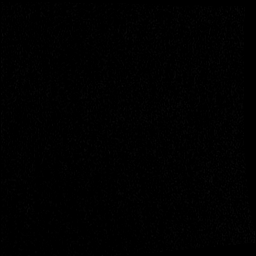
[im 7/44]
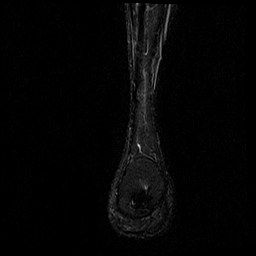
[im 13/44]
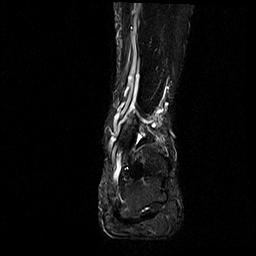
[im 19/44]
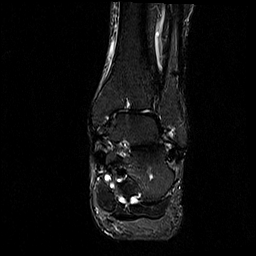
[im 25/44]
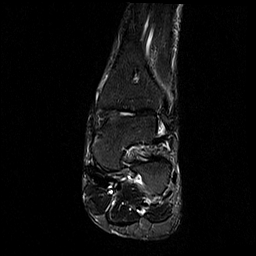
[im 31/44]
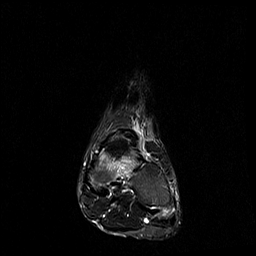
[im 37/44]
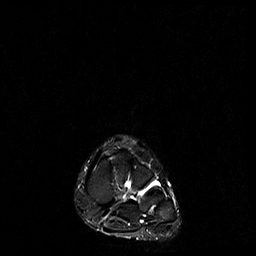
[im 44/44]
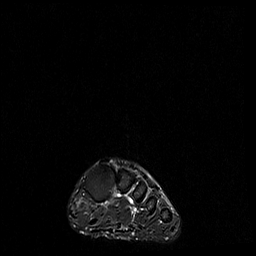

[Series 7: T1 · sagittal · 3.0mm · 0.56mm/px · 5 of 27 slices shown]
[im 1/27]
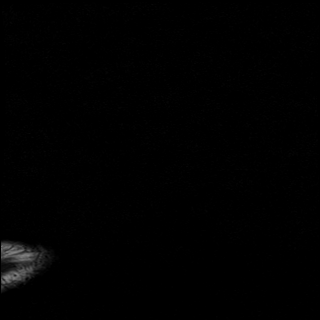
[im 7/27]
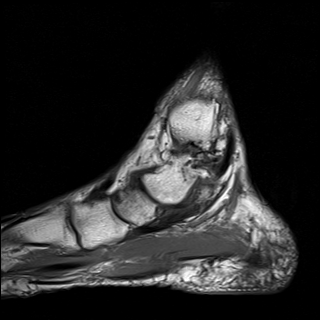
[im 14/27]
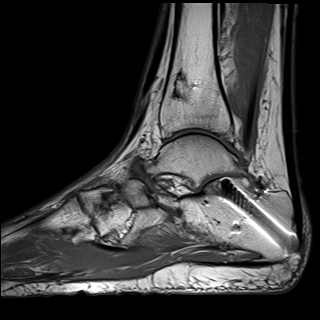
[im 20/27]
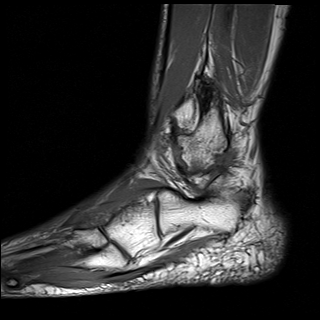
[im 27/27]
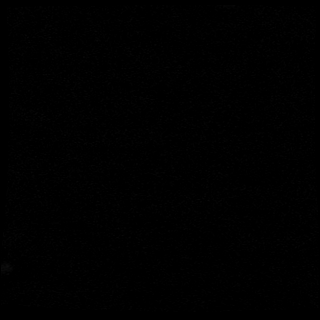

[Series 8: STIR · sagittal · 3.0mm · 0.70mm/px · 5 of 27 slices shown (2 of 3)]
[im 1/27]
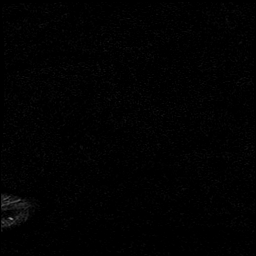
[im 7/27]
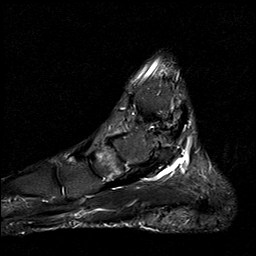
[im 14/27]
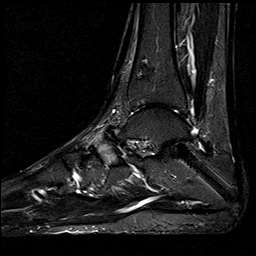
[im 20/27]
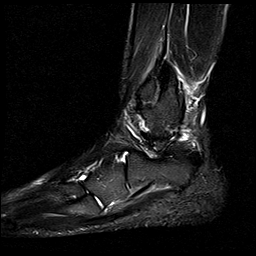
[im 27/27]
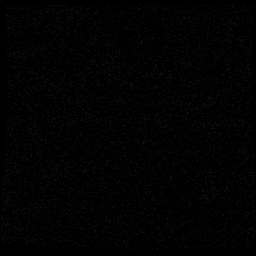

[Series 9: STIR · axial · 3.0mm · 0.70mm/px · z∈[-118,+27]mm · 8 of 45 slices shown (3 of 3)]
[im 1/45]
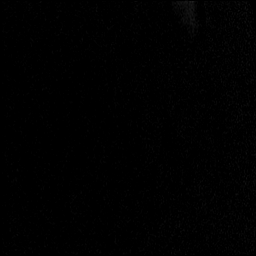
[im 7/45]
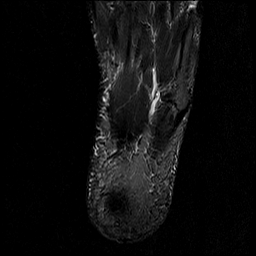
[im 13/45]
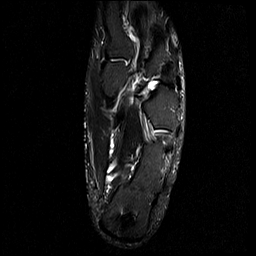
[im 19/45]
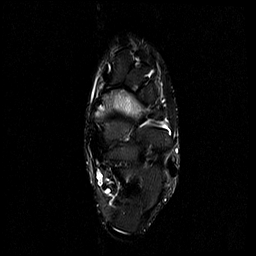
[im 26/45]
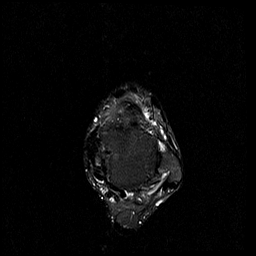
[im 32/45]
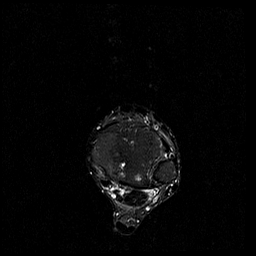
[im 38/45]
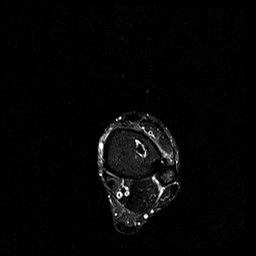
[im 45/45]
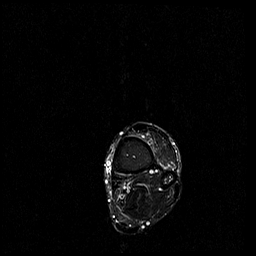

[40 of 40 positions shown; findings below may reference images not displayed]

FINDINGS: TENDONS

Peroneal: Intact and normally positioned.

Posteromedial: The posterior tibialis tendon was previously
demonstrated to be completely ruptured. There is a probable new
anchor screw medially in the navicular, suggesting interval tendon
reconstruction. The tendon appears diminutive within the lower leg
and is not well seen distally. The additional medial flexor tendons
appear intact. There has been possible partial transfer of the
flexor digitorum longus tendon to the navicular.

Anterior: Intact and normally positioned.

Achilles: Intact.

Plantar Fascia: Intact.

LIGAMENTS

Lateral: The anterior and posterior talofibular and calcaneofibular
ligaments are intact.The inferior tibiofibular ligaments appear
intact. Stable postsurgical susceptibility artifact in the distal
fibular diaphysis.

Medial: Chronic susceptibility artifact in the medial malleolus and
medial talus may relate to previous ligamentous reconstruction. No
acute abnormality identified.

CARTILAGE AND BONES

Ankle Joint: Chronic tibiotalar degenerative changes with
subchondral cyst formation and osteophytes posteriorly. No
significant joint effusion.

Subtalar Joints/Sinus Tarsi: Unremarkable.

Bones: Interval calcaneal osteotomy with placement of a cannulated
screw in the calcaneal tuberosity. The osteotomy appears healed
without associated marrow edema. There are progressive degenerative
changes at the talonavicular articulation with dorsal osteophytes
and prominent subchondral cysts and edema throughout the navicular.
Mild additional midfoot degenerative changes are stable. There is a
stable chronic infarct within the distal tibial metaphysis.

Other: No significant soft tissue findings.
IMPRESSION: 1. Interval calcaneal osteotomy without demonstrated complication.
2. Progressive talonavicular degenerative changes with prominent
edema and subchondral cyst formation throughout the navicular.
3. Presumed interval repair of previously demonstrated rupture of
the posterior tibialis tendon. The repaired tendon is not well
visualized distally and may be retorn. Alternately, there may have
been partial flexor digitorum longus tendon transfer to the
navicular. Correlate with prior surgical history. No acute tendon
findings identified.
4. Additional remote postsurgical changes and moderate tibiotalar
degenerative changes are again noted.

## 2021-12-08 ENCOUNTER — Other Ambulatory Visit: Payer: Self-pay | Admitting: Dermatology

## 2021-12-08 ENCOUNTER — Other Ambulatory Visit: Payer: Self-pay | Admitting: Internal Medicine

## 2021-12-08 DIAGNOSIS — L739 Follicular disorder, unspecified: Secondary | ICD-10-CM

## 2021-12-09 NOTE — Telephone Encounter (Signed)
Last filled 10-22-21 #4 ml Last OV 06-17-21 Next OV 06-18-22 Logan Wise

## 2021-12-10 DIAGNOSIS — Z006 Encounter for examination for normal comparison and control in clinical research program: Secondary | ICD-10-CM

## 2021-12-10 NOTE — Research (Addendum)
01-01-22- no return call from patient.   12-26-21-LMOM for patient to return call- following up to interest in V1P study.  12/13/21- LMOM for patient to return call- following up to interest in V1P study.  Spoke with patient regarding V1P study. Verified e-mail on file is correct e-mail address. Copy of ICF e-mailed to patient for review. Will follow up with patient after reviewing ICF to see about moving forward with screening visit.

## 2021-12-12 ENCOUNTER — Encounter: Payer: Self-pay | Admitting: Internal Medicine

## 2022-02-03 ENCOUNTER — Other Ambulatory Visit: Payer: Self-pay | Admitting: Dermatology

## 2022-02-03 DIAGNOSIS — B009 Herpesviral infection, unspecified: Secondary | ICD-10-CM

## 2022-02-03 NOTE — Telephone Encounter (Signed)
Patient needs appointment for future refills.

## 2022-02-04 ENCOUNTER — Other Ambulatory Visit: Payer: 59

## 2022-02-17 ENCOUNTER — Telehealth: Payer: Self-pay | Admitting: Internal Medicine

## 2022-02-17 NOTE — Telephone Encounter (Signed)
Pt sent this VIA Mychart to the scheduling pool:   I have tried several times to call but I work and can't stay on hold longer than a half hour. I will be canceling my lab appoint and canceling all my appt with Dr Harrington Challenger. I do not believe you have my well being at heart and its certainly not individualized. Im treated as everyone else is, given the same meds everyone else is and then robbed like everyone else is.      I cancelled his lab appt per Mychart message and let him know I would forward this to the appropriate person.

## 2022-02-19 ENCOUNTER — Other Ambulatory Visit: Payer: 59

## 2022-03-07 ENCOUNTER — Other Ambulatory Visit: Payer: Self-pay | Admitting: Internal Medicine

## 2022-04-01 ENCOUNTER — Telehealth: Payer: Self-pay | Admitting: Internal Medicine

## 2022-04-01 DIAGNOSIS — Z119 Encounter for screening for infectious and parasitic diseases, unspecified: Secondary | ICD-10-CM

## 2022-04-01 NOTE — Telephone Encounter (Signed)
Order placed

## 2022-04-01 NOTE — Telephone Encounter (Signed)
Patient called in and stated that he needs a TB blood test. He is scheduled for Friday to have the lab done. Thank you!

## 2022-04-01 NOTE — Addendum Note (Signed)
Addended by: Viviana Simpler I on: 04/01/2022 01:19 PM   Modules accepted: Orders

## 2022-04-04 ENCOUNTER — Other Ambulatory Visit: Payer: 59

## 2022-04-04 ENCOUNTER — Other Ambulatory Visit (INDEPENDENT_AMBULATORY_CARE_PROVIDER_SITE_OTHER): Payer: 59

## 2022-04-04 DIAGNOSIS — Z119 Encounter for screening for infectious and parasitic diseases, unspecified: Secondary | ICD-10-CM

## 2022-04-09 LAB — QUANTIFERON-TB GOLD PLUS
Mitogen-NIL: >10 IU/mL
NIL: 0.04 IU/mL
QuantiFERON-TB Gold Plus: NEGATIVE
TB1-NIL: <0 IU/mL
TB2-NIL: <0 IU/mL

## 2022-04-10 ENCOUNTER — Other Ambulatory Visit: Payer: Self-pay | Admitting: Dermatology

## 2022-04-10 DIAGNOSIS — B009 Herpesviral infection, unspecified: Secondary | ICD-10-CM

## 2022-04-10 DIAGNOSIS — L739 Follicular disorder, unspecified: Secondary | ICD-10-CM

## 2022-05-27 ENCOUNTER — Encounter: Payer: Self-pay | Admitting: Internal Medicine

## 2022-06-05 ENCOUNTER — Ambulatory Visit (INDEPENDENT_AMBULATORY_CARE_PROVIDER_SITE_OTHER): Payer: 59 | Admitting: Pulmonary Disease

## 2022-06-05 ENCOUNTER — Other Ambulatory Visit
Admission: RE | Admit: 2022-06-05 | Discharge: 2022-06-05 | Disposition: A | Discharge status: Home / Self Care | Payer: 59 | Source: Ambulatory Visit | Attending: Pulmonary Disease | Admitting: Pulmonary Disease

## 2022-06-05 ENCOUNTER — Encounter: Payer: Self-pay | Admitting: Pulmonary Disease

## 2022-06-05 ENCOUNTER — Ambulatory Visit
Admission: RE | Admit: 2022-06-05 | Discharge: 2022-06-05 | Disposition: A | Discharge status: Home / Self Care | Payer: 59 | Source: Ambulatory Visit | Attending: Pulmonary Disease | Admitting: Pulmonary Disease

## 2022-06-05 VITALS — BP 124/82 | HR 67 | Temp 98.0°F | Ht 72.0 in | Wt 239.2 lb

## 2022-06-05 DIAGNOSIS — R0602 Shortness of breath: Secondary | ICD-10-CM

## 2022-06-05 DIAGNOSIS — J45909 Unspecified asthma, uncomplicated: Secondary | ICD-10-CM

## 2022-06-05 DIAGNOSIS — K219 Gastro-esophageal reflux disease without esophagitis: Secondary | ICD-10-CM | POA: Diagnosis not present

## 2022-06-05 LAB — CBC WITH DIFFERENTIAL/PLATELET
Abs Immature Granulocytes: 0.02 10*3/uL (ref 0.00–0.07)
Basophils Absolute: 0 10*3/uL (ref 0.0–0.1)
Basophils Relative: 1 %
Eosinophils Absolute: 0.1 10*3/uL (ref 0.0–0.5)
Eosinophils Relative: 2 %
HCT: 42.7 % (ref 39.0–52.0)
Hemoglobin: 15 g/dL (ref 13.0–17.0)
Immature Granulocytes: 0 %
Lymphocytes Relative: 32 %
Lymphs Abs: 1.7 10*3/uL (ref 0.7–4.0)
MCH: 31.4 pg (ref 26.0–34.0)
MCHC: 35.1 g/dL (ref 30.0–36.0)
MCV: 89.3 fL (ref 80.0–100.0)
Monocytes Absolute: 0.4 10*3/uL (ref 0.1–1.0)
Monocytes Relative: 8 %
Neutro Abs: 3.1 10*3/uL (ref 1.7–7.7)
Neutrophils Relative %: 57 %
Platelets: 178 10*3/uL (ref 150–400)
RBC: 4.78 MIL/uL (ref 4.22–5.81)
RDW: 11.8 % (ref 11.5–15.5)
WBC: 5.3 10*3/uL (ref 4.0–10.5)
nRBC: 0 % (ref 0.0–0.2)

## 2022-06-05 LAB — NITRIC OXIDE: Nitric Oxide: 20

## 2022-06-05 NOTE — Patient Instructions (Addendum)
Your airways do not show inflammation today which is typical for asthma.  We are checking formal breathing tests, chest x-ray, and blood work.  Will get a chest x-ray as well.  Will see you back in 4 to 6 weeks time, call sooner should any new problems arise.

## 2022-06-05 NOTE — Progress Notes (Signed)
Subjective:    Patient ID: Logan Wise, male    DOB: 14-Jan-1961, 61 y.o.   MRN: [id number] Patient Care Team: Venia Carbon, MD as PCP - General Fay Records, MD as PCP - Cardiology (Cardiology) Venia Carbon, MD as Referring Physician (Internal Medicine) Bary Castilla Forest Gleason, MD (General Surgery)  Chief Complaint  Patient presents with   Consult    SOB randomly. No wheezing or cough. DX with Asthma by PCP.   HPI Patient is a 61 year old remote former smoker with less than 1-pack-year history of smoking who presents for evaluation of "low-grade asthma", he is self-referred.  His primary care physician is Dr. Viviana Simpler.  And states that he has been having difficulties with shortness of breath for approximately 6 months.  He feels like his breathing is restricted.  Not noted much in the way of wheezing.  He uses his albuterol inhaler 3-5 times per day and may note some transient improvement but is definitely not long-lasting.  He states that he has a history of asthma since he was "a baby".  States that he had multiple environmental allergies particularly growing up.  He also states that he "outgrew most of his allergies".  He has never been on controller medication for asthma.  He was prescribed Flovent "a while back" but does not use the medication.  He had an allergy evaluation by ENT 3 to 4 years ago and still showed evidence of allergy response however we do not have those records.  He states that he never had "exercise" asthma and has always been able to exercise without difficulty.  He does note that at rest on occasion particularly while talking he will notice that his breath feels restricted.  He has not had any chest pain, chills or sweats.  No cough or sputum production.  No hemoptysis.  He does have some issues with snoring only if he drinks wine but overall does not have disturbed sleep.  He has noted that over the last 2 years his weight has increased approximately  20 pounds.  Had cardiac evaluation with CT cardiac scoring in February 2023 showing a coronary calcium score of 952 or 96 percentile, he is on statin therapy.  Echocardiogram in May 2023 which was nonrevealing with LVEF 55 to 60% and no evidence of pulmonary hypertension.  A gated myocardial perfusion study in May 2023 that showed good exercise tolerance and no evidence of ischemia.  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  Remote less than 1 year history of smoking.  Does drink alcohol but not to excess.  Works in Programmer, applications.   Review of Systems A 10 point review of systems was performed and it is as noted above otherwise negative.  Past Medical History:  Diagnosis Date   Allergy    Anxiety    Arthritis    Asthma    Depression    Dysplastic nevus 05/31/2009   right periaxillary area near prox bicep   GERD (gastroesophageal reflux disease)    Hyperlipidemia    Hypothyroid 2011   Low testosterone    Past Surgical History:  Procedure Laterality Date   ANKLE RECONSTRUCTION Left 05/2015   HERNIA REPAIR  05/29/14   KNEE ARTHROSCOPY     right   NASAL STENOSIS REPAIR     ROTATOR CUFF REPAIR     left 12/07, right 3/05 ([facility])   SEPTOPLASTY     Patient Active Problem List   Diagnosis Date Noted  Essential hypertension, benign 10/18/2018   Vertigo 10/15/2016   Preventative health care 88/41/6606   Umbilical hernia without obstruction and without gangrene 04/22/2014   Obesity 04/22/2014   Hypogonadism male 01/04/2013   Hemochromatosis 08/11/2011   ADHD, predominantly inattentive type 03/04/2011   Hyperlipidemia    Mood disorder ([place]) 02/02/2008   ALLERGIC RHINITIS 02/02/2008   Allergic asthma 02/02/2008   GERD 02/02/2008   OSTEOARTHRITIS 02/02/2008   Family History  Problem Relation Age of Onset   Cancer Mother        cervical   Diabetes Father    Heart disease Father    Hypertension Father    Social History   Tobacco Use   Smoking status: Former    Smokeless tobacco: Never  Substance Use Topics   Alcohol use: Yes    Alcohol/week: 0.0 standard drinks of alcohol   Allergies  Allergen Reactions   Amoxicillin-Pot Clavulanate Anaphylaxis and Hives   Peanut Oil Shortness Of Breath and Swelling   Peanut-Containing Drug Products Swelling   Current Meds  Medication Sig   albuterol (VENTOLIN HFA) 108 (90 Base) MCG/ACT inhaler INHALE TWO PUFFS BY MOUTH EVERY 6 HOURS AS NEEDED FOR WHEEZING OR SHORTNESS OF BREATH   amLODipine (NORVASC) 2.5 MG tablet TAKE ONE TABLET BY MOUTH DAILY   aspirin EC 81 MG tablet Take 1 tablet (81 mg total) by mouth daily. Swallow whole.   cetirizine (ZYRTEC) 10 MG tablet Take 10 mg by mouth daily.   esomeprazole (NEXIUM) 40 MG capsule Take 1 capsule (40 mg total) by mouth daily.   fluticasone (FLOVENT HFA) 110 MCG/ACT inhaler Inhale into the lungs. 1-2 times a week   ketoconazole (NIZORAL) 2 % shampoo APPLY TOPICALLY TO SCALP 3 TIMES A WEEK. LET SIT 5 MINUTES AND RINSE OUT   montelukast (SINGULAIR) 10 MG tablet Take 1 tablet (10 mg total) by mouth at bedtime.   rosuvastatin (CRESTOR) 20 MG tablet Take 1 tablet (20 mg total) by mouth daily.   tadalafil (CIALIS) 20 MG tablet TAKE ONE TABLET BY MOUTH DAILY AS NEEDED FOR ERECTILE DYSFUNCTION   testosterone cypionate (DEPOTESTOSTERONE CYPIONATE) 200 MG/ML injection INJECT ONE ML INTO THE MUSCLE EVERY SEVEN DAYS   valACYclovir (VALTREX) 1000 MG tablet TAKE 2 TABLETS AT ONSET OF FEVER BLISTERS, THEN 2 TABLETS 12 HOURS LATER   valsartan-hydrochlorothiazide (DIOVAN-HCT) 320-25 MG tablet Take 1 tablet by mouth daily.   Immunization History  Administered Date(s) Administered   Hepatitis B 08/05/2007, 08/26/2007, 10/15/2007   Influenza Split 03/20/2011, 02/18/2017   Influenza Whole 04/17/2009, 04/09/2010   Influenza,inj,Quad PF,6+ Mos 08/01/2014, 04/01/2016, 04/27/2019   Influenza-Unspecified 05/18/2018   PPD Test 03/04/2011, 01/26/2013, 01/25/2014, 04/06/2015,  04/01/2016, 08/24/2017   Td 06/30/2002   Tdap 07/15/2012   Zoster Recombinat (Shingrix) 04/27/2019       Objective:   Physical Exam BP 124/82 (BP Location: Left Arm, Cuff Size: Large)   Pulse 67   Temp 98 F (36.7 C)   Ht 6' (1.829 m)   Wt 239 lb 3.2 oz (108.5 kg)   SpO2 95%   BMI 32.44 kg/m  GENERAL: Overweight gentleman, no acute distress, fully ambulatory, no conversational dyspnea. HEAD: Normocephalic, atraumatic.  EYES: Pupils equal, round, reactive to light.  No scleral icterus.  MOUTH: Dentition intact, oral mucosa moist. NECK: Supple. No thyromegaly. Trachea midline. No JVD.  No adenopathy. PULMONARY: Good air entry bilaterally.  No adventitious sounds. CARDIOVASCULAR: S1 and S2. Regular rate and rhythm.  No rubs, murmurs or gallops heard. ABDOMEN: Mildly protuberant,  otherwise benign. MUSCULOSKELETAL: No joint deformity, no clubbing, no edema.  NEUROLOGIC: No overt focal deficit, no gait disturbance, speech is fluent. SKIN: Intact,warm,dry. PSYCH: Mood and behavior normal.     Latest Ref Rng & Units 04/04/2022   11:44 AM  Quantiferon TB Gold  Quantiferon TB Gold Plus NEGATIVE NEGATIVE     Office Spirometry Results: FEV1 is 3.14 L or 82% of predicted, FVC is 4.08 L or 80% predicted, FEV1/FVC 77%.  There is suggestion of mild restriction, no obstruction.  Patient will need formal PFTs to better assess the restrictive component.     Assessment & Plan:     ICD-10-CM   1. SOB (shortness of breath)  R06.02 Nitric oxide    Spirometry with Graph    Pulmonary Function Test [facility] Only    DG Chest 2 View   Data is not supporting asthma as etiology Query restrictive physiology due to obesity versus other Chest x-ray, full PFTs to better evaluate    2. Uncomplicated asthma, unspecified asthma severity, unspecified whether persistent  J45.909 Allergen Panel (27) + IGE    CBC with Differential/Platelet    CANCELED: CBC with Differential/Platelet    CANCELED: Allergen  Panel (27) + IGE   Patient has a history of asthma/environmental allergies FENO today not consistent with type II inflammation Recheck allergen panel, CBC with differential    3. Gastroesophageal reflux disease, unspecified whether esophagitis present  K21.9    Patient states well-controlled with dietary change On PPI     Orders Placed This Encounter  Procedures   DG Chest 2 View    Standing Status:   Future    Number of Occurrences:   1    Standing Expiration Date:   06/06/2023    Order Specific Question:   Reason for Exam (SYMPTOM  OR DIAGNOSIS REQUIRED)    Answer:   SOB    Order Specific Question:   Preferred imaging location?    Answer:   [facility]   Allergen Panel (27) + IGE    Standing Status:   Future    Number of Occurrences:   1    Standing Expiration Date:   12/05/2022   CBC with Differential/Platelet    Standing Status:   Future    Number of Occurrences:   1    Standing Expiration Date:   06/06/2023   Nitric oxide   Pulmonary Function Test [facility] Only    Standing Status:   Future    Standing Expiration Date:   06/06/2023    Order Specific Question:   Full PFT: includes the following: basic spirometry, spirometry pre & post bronchodilator, diffusion capacity (DLCO), lung volumes    Answer:   Full PFT    Order Specific Question:   This test can only be performed at    Answer:   [facility]   Spirometry with Graph    Order Specific Question:   Where should this test be performed?    Answer:   [facility]   At present it does not appear that asthma is the main driver for the patient's shortness of breath.  We have ordered studies as above.  We will be seeing the patient in follow-up in 4 to 6 weeks time he is to call sooner should any new problems arise.  Renold Don, MD Advanced Bronchoscopy PCCM [facility]    *This note was dictated using voice recognition software/Dragon.  Despite best efforts to proofread, errors can  occur which can  change the meaning. Any transcriptional errors that result from this process are unintentional and may not be fully corrected at the time of dictation.

## 2022-06-18 ENCOUNTER — Encounter: Payer: 59 | Admitting: Internal Medicine

## 2022-07-02 ENCOUNTER — Encounter: Payer: Self-pay | Admitting: Internal Medicine

## 2022-07-02 ENCOUNTER — Ambulatory Visit (INDEPENDENT_AMBULATORY_CARE_PROVIDER_SITE_OTHER): Payer: 59 | Admitting: Internal Medicine

## 2022-07-02 VITALS — BP 132/84 | HR 74 | Temp 97.4°F | Ht 71.25 in | Wt 238.0 lb

## 2022-07-02 DIAGNOSIS — Z0000 Encounter for general adult medical examination without abnormal findings: Secondary | ICD-10-CM

## 2022-07-02 DIAGNOSIS — I10 Essential (primary) hypertension: Secondary | ICD-10-CM | POA: Diagnosis not present

## 2022-07-02 DIAGNOSIS — Z125 Encounter for screening for malignant neoplasm of prostate: Secondary | ICD-10-CM

## 2022-07-02 DIAGNOSIS — J4520 Mild intermittent asthma, uncomplicated: Secondary | ICD-10-CM | POA: Diagnosis not present

## 2022-07-02 DIAGNOSIS — E291 Testicular hypofunction: Secondary | ICD-10-CM

## 2022-07-02 DIAGNOSIS — K219 Gastro-esophageal reflux disease without esophagitis: Secondary | ICD-10-CM

## 2022-07-02 NOTE — Assessment & Plan Note (Addendum)
Uses rolaids prn

## 2022-07-02 NOTE — Assessment & Plan Note (Signed)
Healthy but has let himself go Colon due 2026 Will check PSA Prefers no flu or COVID vaccines  Reaction with first shingrix

## 2022-07-02 NOTE — Assessment & Plan Note (Signed)
BP Readings from Last 3 Encounters:  07/02/22 132/84  06/05/22 124/82  07/02/21 (!) 146/84   Controlled on valsartan/HCTZ 320/25 and amlodipine 2.'5mg'$  daily

## 2022-07-02 NOTE — Assessment & Plan Note (Signed)
Mostly controlled with flovent---out of shape though

## 2022-07-02 NOTE — Progress Notes (Signed)
Subjective:    Patient ID: Logan Wise, male    DOB: 1960/07/14, 62 y.o.   MRN: [id number]  HPI Here for physical  Many physician visits Cardiology work up reassuring Seen by Dr Patsey Berthold for persistent asthma--not really inflammation (relates to weight) Weight up 10# since last year  Needs ankle and shoulder replacement Limiting exercise--can just do bike Has ultrasound guided lavage scheduled for shoulder  Had flu over Christmas Recovering from that  Current Outpatient Medications on File Prior to Visit  Medication Sig Dispense Refill   albuterol (VENTOLIN HFA) 108 (90 Base) MCG/ACT inhaler INHALE TWO PUFFS BY MOUTH EVERY 6 HOURS AS NEEDED FOR WHEEZING OR SHORTNESS OF BREATH 18 g 0   cetirizine (ZYRTEC) 10 MG tablet Take 10 mg by mouth daily.     esomeprazole (NEXIUM) 40 MG capsule Take 1 capsule (40 mg total) by mouth daily. 90 capsule 3   fluticasone (FLOVENT HFA) 110 MCG/ACT inhaler Inhale into the lungs. 1-2 times a week     ketoconazole (NIZORAL) 2 % shampoo APPLY TOPICALLY TO SCALP 3 TIMES A WEEK. LET SIT 5 MINUTES AND RINSE OUT 120 mL 0   tadalafil (CIALIS) 20 MG tablet TAKE ONE TABLET BY MOUTH DAILY AS NEEDED FOR ERECTILE DYSFUNCTION 12 tablet 2   testosterone cypionate (DEPOTESTOSTERONE CYPIONATE) 200 MG/ML injection INJECT ONE ML INTO THE MUSCLE EVERY SEVEN DAYS 4 mL 5   valACYclovir (VALTREX) 1000 MG tablet TAKE 2 TABLETS AT ONSET OF FEVER BLISTERS, THEN 2 TABLETS 12 HOURS LATER 30 tablet 0   valsartan-hydrochlorothiazide (DIOVAN-HCT) 320-25 MG tablet Take 1 tablet by mouth daily. 90 tablet 3   No current facility-administered medications on file prior to visit.    Allergies  Allergen Reactions   Amoxicillin-Pot Clavulanate Anaphylaxis and Hives   Other Anaphylaxis   Peanut Oil Shortness Of Breath and Swelling   Peanut-Containing Drug Products Swelling    Past Medical History:  Diagnosis Date   Allergy    Anxiety    Arthritis    Asthma     Depression    Dysplastic nevus 05/31/2009   right periaxillary area near prox bicep   GERD (gastroesophageal reflux disease)    Hyperlipidemia    Hypothyroid 2011   Low testosterone     Past Surgical History:  Procedure Laterality Date   ANKLE RECONSTRUCTION Left 05/2015   HERNIA REPAIR  05/29/14   KNEE ARTHROSCOPY     right   NASAL STENOSIS REPAIR     ROTATOR CUFF REPAIR     left 12/07, right 3/05 ([facility])   SEPTOPLASTY      Family History  Problem Relation Age of Onset   Cancer Mother        cervical   Diabetes Father    Heart disease Father    Hypertension Father     Social History   Socioeconomic History   Marital status: Married    Spouse name: Not on file   Number of children: 3   Years of education: Not on file   Highest education level: Not on file  Occupational History   Occupation: outside Press photographer    Comment: [place]  Tobacco Use   Smoking status: Former    Passive exposure: Never   Smokeless tobacco: Never  Vaping Use   Vaping Use: Never used  Substance and Sexual Activity   Alcohol use: Yes    Alcohol/week: 0.0 standard drinks of alcohol   Drug use: No   Sexual activity: Not on file  Other Topics Concern   Not on file  Social History Narrative   Not on file   Social Determinants of Health   Financial Resource Strain: Not on file  Food Insecurity: Not on file  Transportation Needs: Not on file  Physical Activity: Not on file  Stress: Not on file  Social Connections: Not on file  Intimate Partner Violence: Not on file   Review of Systems  Constitutional:  Positive for unexpected weight change. Negative for fatigue.       Wears seat belt  HENT:  Positive for tinnitus. Negative for dental problem, hearing loss and trouble swallowing.        Keeps up with dentist  Eyes:  Negative for visual disturbance.       No diplopia or unilateral vision loss  Respiratory:  Positive for cough.        Some chronic DOE--asthma  Cardiovascular:   Negative for chest pain, palpitations and leg swelling.  Gastrointestinal:  Negative for blood in stool and constipation.       Some heartburn--prn rolaids  Endocrine: Negative for polydipsia and polyuria.  Genitourinary:  Negative for difficulty urinating and urgency.       Satisfied with cialis  Musculoskeletal:  Positive for arthralgias. Negative for joint swelling.  Skin:  Negative for rash.  Allergic/Immunologic: Positive for environmental allergies. Negative for immunocompromised state.       Uses zyrtec prn  Neurological:  Negative for dizziness, syncope, light-headedness and headaches.       BPPV is better controlled now---with Epley's  Hematological:  Negative for adenopathy. Does not bruise/bleed easily.  Psychiatric/Behavioral:  Negative for decreased concentration, dysphoric mood and sleep disturbance. The patient is not nervous/anxious.        Objective:   Physical Exam Constitutional:      Appearance: Normal appearance.  HENT:     Mouth/Throat:     Pharynx: No oropharyngeal exudate or posterior oropharyngeal erythema.  Eyes:     Conjunctiva/sclera: Conjunctivae normal.     Pupils: Pupils are equal, round, and reactive to light.  Cardiovascular:     Rate and Rhythm: Normal rate and regular rhythm.     Pulses: Normal pulses.     Heart sounds:     No gallop.     Comments: ? Very faint systolic murmur apex Pulmonary:     Effort: Pulmonary effort is normal.     Breath sounds: Normal breath sounds. No wheezing or rales.  Abdominal:     Palpations: Abdomen is soft.     Tenderness: There is no abdominal tenderness.  Musculoskeletal:     Cervical back: Neck supple.     Right lower leg: No edema.     Left lower leg: No edema.  Lymphadenopathy:     Cervical: No cervical adenopathy.  Skin:    Findings: No lesion or rash.  Neurological:     General: No focal deficit present.     Mental Status: He is alert and oriented to person, place, and time.  Psychiatric:         Mood and Affect: Mood normal.        Behavior: Behavior normal.            Assessment & Plan:

## 2022-07-02 NOTE — Assessment & Plan Note (Signed)
Continues on testosterone

## 2022-07-02 NOTE — Assessment & Plan Note (Signed)
Gained weight Working on fitness but tough with ortho problems

## 2022-07-02 NOTE — Addendum Note (Signed)
Addended by: Viviana Simpler I on: 07/02/2022 10:57 AM   Modules accepted: Orders

## 2022-07-07 ENCOUNTER — Telehealth: Payer: Self-pay | Admitting: Internal Medicine

## 2022-07-07 MED ORDER — ALBUTEROL SULFATE HFA 108 (90 BASE) MCG/ACT IN AERS: INHALATION_SPRAY | RESPIRATORY_TRACT | 0 refills | Status: DC

## 2022-07-07 NOTE — Telephone Encounter (Signed)
Prescription Request  07/07/2022  Is this a "Controlled Substance" medicine? No  LOV: 07/02/2022  What is the name of the medication or equipment?   albuterol (VENTOLIN HFA) 108 (90 Base) MCG/ACT inhaler  Have you contacted your pharmacy to request a refill? No   Which pharmacy would you like this sent to?  [facility] [id number], [place] [address] Phone: [phone] Fax: [phone]    Patient notified that their request is being sent to the clinical staff for review and that they should receive a response within 2 business days.   Please advise at Mobile [phone] (mobile)

## 2022-07-07 NOTE — Telephone Encounter (Signed)
Rx sent electronically.  

## 2022-07-10 ENCOUNTER — Other Ambulatory Visit (INDEPENDENT_AMBULATORY_CARE_PROVIDER_SITE_OTHER): Payer: 59

## 2022-07-10 DIAGNOSIS — I10 Essential (primary) hypertension: Secondary | ICD-10-CM | POA: Diagnosis not present

## 2022-07-10 DIAGNOSIS — Z125 Encounter for screening for malignant neoplasm of prostate: Secondary | ICD-10-CM | POA: Diagnosis not present

## 2022-07-10 LAB — CBC
HCT: 43.4 % (ref 39.0–52.0)
Hemoglobin: 15.1 g/dL (ref 13.0–17.0)
MCHC: 34.7 g/dL (ref 30.0–36.0)
MCV: 91.9 fl (ref 78.0–100.0)
Platelets: 193 10*3/uL (ref 150.0–400.0)
RBC: 4.72 Mil/uL (ref 4.22–5.81)
RDW: 13.1 % (ref 11.5–15.5)
WBC: 4.2 10*3/uL (ref 4.0–10.5)

## 2022-07-10 LAB — LIPID PANEL
Cholesterol: 257 mg/dL — ABNORMAL HIGH (ref 0–200)
HDL: 59.1 mg/dL (ref >39.00)
LDL Cholesterol: 174 mg/dL — ABNORMAL HIGH (ref 0–99)
NonHDL: 198.24
Total CHOL/HDL Ratio: 4
Triglycerides: 122 mg/dL (ref 0.0–149.0)
VLDL: 24.4 mg/dL (ref 0.0–40.0)

## 2022-07-10 LAB — PSA: PSA: 0.69 ng/mL (ref 0.10–4.00)

## 2022-07-10 LAB — COMPREHENSIVE METABOLIC PANEL
ALT: 38 U/L (ref 0–53)
AST: 24 U/L (ref 0–37)
Albumin: 4.5 g/dL (ref 3.5–5.2)
Alkaline Phosphatase: 51 U/L (ref 39–117)
BUN: 21 mg/dL (ref 6–23)
CO2: 30 mEq/L (ref 19–32)
Calcium: 9.3 mg/dL (ref 8.4–10.5)
Chloride: 100 mEq/L (ref 96–112)
Creatinine, Ser: 1.33 mg/dL (ref 0.40–1.50)
GFR: 57.69 mL/min — ABNORMAL LOW (ref >60.00)
Glucose, Bld: 90 mg/dL (ref 70–99)
Potassium: 4.2 mEq/L (ref 3.5–5.1)
Sodium: 137 mEq/L (ref 135–145)
Total Bilirubin: 0.9 mg/dL (ref 0.2–1.2)
Total Protein: 6.7 g/dL (ref 6.0–8.3)

## 2022-07-10 LAB — TSH: TSH: 1.73 u[IU]/mL (ref 0.35–5.50)

## 2022-07-10 LAB — HEMOGLOBIN A1C: Hgb A1c MFr Bld: 5.6 % (ref 4.6–6.5)

## 2022-07-15 ENCOUNTER — Ambulatory Visit: Payer: 59 | Attending: Pulmonary Disease

## 2022-07-15 DIAGNOSIS — R0602 Shortness of breath: Secondary | ICD-10-CM

## 2022-07-15 LAB — PULMONARY FUNCTION TEST ARMC ONLY
DL/VA % pred: 106 %
DL/VA: 4.43 ml/min/mmHg/L
DLCO unc % pred: 110 %
DLCO unc: 32.18 ml/min/mmHg
FEF 25-75 Post: 4.15 L/sec
FEF 25-75 Pre: 2.16 L/sec
FEF2575-%Change-Post: 92 %
FEF2575-%Pred-Post: 133 %
FEF2575-%Pred-Pre: 69 %
FEV1-%Change-Post: 21 %
FEV1-%Pred-Post: 85 %
FEV1-%Pred-Pre: 70 %
FEV1-Post: 3.26 L
FEV1-Pre: 2.69 L
FEV1FVC-%Change-Post: 2 %
FEV1FVC-%Pred-Pre: 99 %
FEV6-%Change-Post: 17 %
FEV6-%Pred-Post: 86 %
FEV6-%Pred-Pre: 73 %
FEV6-Post: 4.2 L
FEV6-Pre: 3.57 L
FEV6FVC-%Change-Post: 0 %
FEV6FVC-%Pred-Post: 104 %
FEV6FVC-%Pred-Pre: 104 %
FVC-%Change-Post: 18 %
FVC-%Pred-Post: 82 %
FVC-%Pred-Pre: 70 %
FVC-Post: 4.21 L
FVC-Pre: 3.57 L
Post FEV1/FVC ratio: 77 %
Post FEV6/FVC ratio: 100 %
Pre FEV1/FVC ratio: 75 %
Pre FEV6/FVC Ratio: 100 %
RV % pred: 137 %
RV: 3.26 L
TLC % pred: 101 %
TLC: 7.51 L

## 2022-07-15 MED ORDER — ALBUTEROL SULFATE (2.5 MG/3ML) 0.083% IN NEBU: 2.5000 mg | INHALATION_SOLUTION | Freq: Once | RESPIRATORY_TRACT | Status: AC

## 2022-07-16 ENCOUNTER — Other Ambulatory Visit: Payer: Self-pay | Admitting: Internal Medicine

## 2022-07-16 ENCOUNTER — Encounter: Payer: Self-pay | Admitting: Internal Medicine

## 2022-07-16 DIAGNOSIS — R799 Abnormal finding of blood chemistry, unspecified: Secondary | ICD-10-CM

## 2022-07-16 MED ORDER — TESTOSTERONE CYPIONATE 200 MG/ML IM SOLN: INTRAMUSCULAR | 5 refills | Status: DC

## 2022-07-16 NOTE — Addendum Note (Signed)
Addended by: Pilar Grammes on: 07/16/2022 10:56 AM   Modules accepted: Orders

## 2022-07-16 NOTE — Telephone Encounter (Signed)
Prescription Request  07/16/2022  Is this a "Controlled Substance" medicine? No  LOV: 07/02/2022  What is the name of the medication or equipment? testosterone cypionate (DEPOTESTOSTERONE CYPIONATE) 200 MG/ML injection   Have you contacted your pharmacy to request a refill? Yes   Pt states pharmacy told him they currently have meds in stock after being out of stock in 3 months   Which pharmacy would you like this sent to?  [facility] [id number], [place] [address] Phone: [phone] Fax: [phone]    Patient notified that their request is being sent to the clinical staff for review and that they should receive a response within 2 business days.   Please advise at Mobile [phone] (mobile)

## 2022-07-16 NOTE — Addendum Note (Signed)
Addended by: Viviana Simpler I on: 07/16/2022 01:41 PM   Modules accepted: Orders

## 2022-07-17 ENCOUNTER — Encounter: Payer: Self-pay | Admitting: Pulmonary Disease

## 2022-07-17 ENCOUNTER — Ambulatory Visit: Payer: 59 | Admitting: Pulmonary Disease

## 2022-07-17 VITALS — BP 138/84 | HR 69 | Temp 98.0°F | Ht 71.25 in | Wt 239.0 lb

## 2022-07-17 DIAGNOSIS — R0602 Shortness of breath: Secondary | ICD-10-CM | POA: Diagnosis not present

## 2022-07-17 DIAGNOSIS — E669 Obesity, unspecified: Secondary | ICD-10-CM | POA: Diagnosis not present

## 2022-07-17 DIAGNOSIS — J4530 Mild persistent asthma, uncomplicated: Secondary | ICD-10-CM | POA: Diagnosis not present

## 2022-07-17 MED ORDER — TRELEGY ELLIPTA 100-62.5-25 MCG/ACT IN AEPB: 1.0000 | INHALATION_SPRAY | Freq: Every day | RESPIRATORY_TRACT | 11 refills | Status: DC

## 2022-07-17 MED ORDER — TRELEGY ELLIPTA 100-62.5-25 MCG/ACT IN AEPB: 1.0000 | INHALATION_SPRAY | Freq: Every day | RESPIRATORY_TRACT | 0 refills | Status: DC

## 2022-07-17 NOTE — Progress Notes (Signed)
Subjective:    Patient ID: Logan Wise, male    DOB: 1961/02/15, 62 y.o.   MRN: [id number] Patient Care Team: Venia Carbon, MD as PCP - General Fay Records, MD as PCP - Cardiology (Cardiology) Venia Carbon, MD as Referring Physician (Internal Medicine) Bary Castilla Forest Gleason, MD (General Surgery)  Chief Complaint  Patient presents with   Follow-up    SOB when he bends over and after he eats. No wheezing. No cough.    HPI Patient is a 62 year old remote former smoker with less than 1-pack-year history of smoking who presents for follow-up on the issue of low-grade asthma".  He was initially seen here on 05 June 2022 for the details of that visit please refer to the note.  This is a scheduled follow-up visit to discuss PFTs and next steps.  The patient had noted difficulties with shortness of breath for approximately 6 to 7 months mostly when he bends over and after he eats.  He had not noted any wheezing or cough.  He had pulmonary function testing performed on 15 July 2022 which has confirmed that he has reversible obstructive airways consistent with asthma.  The issues that he has some bending over and after he eats are likely related to truncal obesity as noted by his low ERV on PFTs.  He has not had any fevers, chills or sweats.  No cough or sputum production.  He does have occasional shortness of breath on inclines but for the most part is as noted above on bending and after he eats.  He does not endorse any other symptomatology today.   DATA: 07/15/2022 PFTs: FEV1 2.69 L or 70% predicted, FVC 3.  By 7 L or 70% predicted, FEV1/FVC 75%, postbronchodilator there is a 21% improvement on FEV1 and an 18% improvement on FVC indicating reversible airways component lung volumes show trapping, ERV low system with obesity.  Diffusion capacity normal.  Review of Systems A 10 point review of systems was performed and it is as noted above otherwise negative.  Patient Active  Problem List   Diagnosis Date Noted   Essential hypertension, benign 10/18/2018   Vertigo 10/15/2016   Preventative health care 08/01/2014   Obesity 04/22/2014   Hypogonadism male 01/04/2013   Hemochromatosis 08/11/2011   Hyperlipidemia    Mood disorder ([place]) 02/02/2008   ALLERGIC RHINITIS 02/02/2008   Allergic asthma 02/02/2008   GERD 02/02/2008   OSTEOARTHRITIS 02/02/2008   Social History   Tobacco Use   Smoking status: Former    Passive exposure: Never   Smokeless tobacco: Never  Substance Use Topics   Alcohol use: Yes    Alcohol/week: 0.0 standard drinks of alcohol   Allergies  Allergen Reactions   Amoxicillin-Pot Clavulanate Anaphylaxis and Hives   Other Anaphylaxis   Peanut Oil Shortness Of Breath and Swelling   Peanut-Containing Drug Products Swelling   Current Meds  Medication Sig   albuterol (VENTOLIN HFA) 108 (90 Base) MCG/ACT inhaler INHALE TWO PUFFS BY MOUTH EVERY 6 HOURS AS NEEDED FOR WHEEZING OR SHORTNESS OF BREATH   ketoconazole (NIZORAL) 2 % shampoo APPLY TOPICALLY TO SCALP 3 TIMES A WEEK. LET SIT 5 MINUTES AND RINSE OUT   tadalafil (CIALIS) 20 MG tablet TAKE ONE TABLET BY MOUTH DAILY AS NEEDED FOR ERECTILE DYSFUNCTION   testosterone cypionate (DEPOTESTOSTERONE CYPIONATE) 200 MG/ML injection INJECT ONE ML INTO THE MUSCLE EVERY SEVEN DAYS   valACYclovir (VALTREX) 1000 MG tablet TAKE 2 TABLETS AT ONSET OF FEVER BLISTERS,  THEN 2 TABLETS 12 HOURS LATER   Immunization History  Administered Date(s) Administered   Hepatitis B 08/05/2007, 08/26/2007, 10/15/2007   Influenza Split 03/20/2011, 02/18/2017   Influenza Whole 04/17/2009, 04/09/2010   Influenza,inj,Quad PF,6+ Mos 08/01/2014, 04/01/2016, 04/27/2019   Influenza-Unspecified 05/18/2018   PPD Test 03/04/2011, 01/26/2013, 01/25/2014, 04/06/2015, 04/01/2016, 08/24/2017   Td 06/30/2002   Tdap 07/15/2012   Zoster Recombinat (Shingrix) 04/27/2019       Objective:   Physical Exam BP 138/84 (BP Location:  Left Arm, Cuff Size: Large)   Pulse 69   Temp 98 F (36.7 C)   Ht 5' 11.25" (1.81 m)   Wt 239 lb (108.4 kg)   SpO2 96%   BMI 33.10 kg/m   SpO2: 96 % O2 Device: None (Room air)  GENERAL: Overweight gentleman, no acute distress, fully ambulatory, no conversational dyspnea. HEAD: Normocephalic, atraumatic.  EYES: Pupils equal, round, reactive to light.  No scleral icterus.  MOUTH: Dentition intact, oral mucosa moist. NECK: Supple. No thyromegaly. Trachea midline. No JVD.  No adenopathy. PULMONARY: Good air entry bilaterally.  No adventitious sounds. CARDIOVASCULAR: S1 and S2. Regular rate and rhythm.  No rubs, murmurs or gallops heard. ABDOMEN: Mildly protuberant, otherwise benign. MUSCULOSKELETAL: No joint deformity, no clubbing, no edema.  NEUROLOGIC: No overt focal deficit, no gait disturbance, speech is fluent. SKIN: Intact,warm,dry. PSYCH: Mood and behavior normal.    Lab Results  Component Value Date   NITRICOXIDE 20 06/05/2022       Assessment & Plan:     ICD-10-CM   1. Mild persistent asthma without complication  S93.73    Trial of Trelegy Ellipta 100, 1 puff daily Continue as needed albuterol    2. SOB (shortness of breath)  R06.02    Multifactorial Poorly compensated asthma/obesity    3. Class 1 obesity  E66.9    Weight loss recommended He has significant truncal obesity Explains his bendopnea     Meds ordered this encounter  Medications   Fluticasone-Umeclidin-Vilant (TRELEGY ELLIPTA) 100-62.5-25 MCG/ACT AEPB    Sig: Inhale 1 puff into the lungs daily.    Dispense:  14 each    Refill:  0    Order Specific Question:   Lot Number?    Answer:   [id number]    Order Specific Question:   Expiration Date?    Answer:   10/29/2023    Order Specific Question:   Manufacturer?    Answer:   [employer] [[id number]]    Order Specific Question:   Quantity    Answer:   1   Fluticasone-Umeclidin-Vilant (TRELEGY ELLIPTA) 100-62.5-25 MCG/ACT AEPB    Sig: Inhale 1 puff into  the lungs daily.    Dispense:  28 each    Refill:  11   Will see the patient in follow-up in 4 months time he is to contact us prior to that time should any new difficulties arise.  Renold Don, MD Advanced Bronchoscopy PCCM [facility]    *This note was dictated using voice recognition software/Dragon.  Despite best efforts to proofread, errors can occur which can change the meaning. Any transcriptional errors that result from this process are unintentional and may not be fully corrected at the time of dictation.

## 2022-07-17 NOTE — Patient Instructions (Addendum)
The breathing tests confirmed asthma. Will place you on a inhaler called Trelegy this is 1 puff daily.  Make sure you rinse your mouth well after you use it.  This should also help with your breathing.  There is also some restriction of your breathing due to weight.  However the good news is that just a modest amount of weight loss (10 pounds or so) can help with this.   Continue your exercise program and weight loss.  We will see you in follow-up in 4 months time call sooner should any new problems arise.

## 2022-07-28 ENCOUNTER — Encounter: Payer: Self-pay | Admitting: Pulmonary Disease

## 2022-07-29 ENCOUNTER — Other Ambulatory Visit: Payer: Self-pay | Admitting: Internal Medicine

## 2022-08-15 ENCOUNTER — Other Ambulatory Visit: Payer: Self-pay | Admitting: Internal Medicine

## 2022-09-01 ENCOUNTER — Encounter: Payer: Self-pay | Admitting: Internal Medicine

## 2022-09-19 ENCOUNTER — Other Ambulatory Visit: Payer: Self-pay | Admitting: Internal Medicine

## 2022-10-14 ENCOUNTER — Other Ambulatory Visit: Payer: Self-pay | Admitting: Internal Medicine

## 2022-10-23 ENCOUNTER — Other Ambulatory Visit: Payer: Self-pay | Admitting: Dermatology

## 2022-10-23 ENCOUNTER — Telehealth: Payer: Self-pay

## 2022-10-23 DIAGNOSIS — L739 Follicular disorder, unspecified: Secondary | ICD-10-CM

## 2022-10-23 NOTE — Telephone Encounter (Signed)
Patient called requesting a refill of Ketoconazole shampoo,   LM on VM  over 1 year patient will need to return to the office before any refill

## 2022-10-30 ENCOUNTER — Ambulatory Visit: Payer: 59 | Admitting: Dermatology

## 2022-10-30 VITALS — BP 165/95 | HR 72

## 2022-10-30 DIAGNOSIS — L719 Rosacea, unspecified: Secondary | ICD-10-CM | POA: Diagnosis not present

## 2022-10-30 DIAGNOSIS — Z79899 Other long term (current) drug therapy: Secondary | ICD-10-CM

## 2022-10-30 DIAGNOSIS — B009 Herpesviral infection, unspecified: Secondary | ICD-10-CM

## 2022-10-30 DIAGNOSIS — L738 Other specified follicular disorders: Secondary | ICD-10-CM | POA: Diagnosis not present

## 2022-10-30 DIAGNOSIS — L739 Follicular disorder, unspecified: Secondary | ICD-10-CM

## 2022-10-30 DIAGNOSIS — Z7189 Other specified counseling: Secondary | ICD-10-CM

## 2022-10-30 MED ORDER — KETOCONAZOLE 2 % EX SHAM: MEDICATED_SHAMPOO | CUTANEOUS | 11 refills | Status: AC

## 2022-10-30 MED ORDER — CLOBETASOL PROPIONATE 0.05 % EX SHAM: MEDICATED_SHAMPOO | CUTANEOUS | 11 refills | Status: AC

## 2022-10-30 NOTE — Patient Instructions (Signed)
Due to recent changes in healthcare laws, you may see results of your pathology and/or laboratory studies on MyChart before the doctors have had a chance to review them. We understand that in some cases there may be results that are confusing or concerning to you. Please understand that not all results are received at the same time and often the doctors may need to interpret multiple results in order to provide you with the best plan of care or course of treatment. Therefore, we ask that you please give us 2 business days to thoroughly review all your results before contacting the office for clarification. Should we see a critical lab result, you will be contacted sooner.   If You Need Anything After Your Visit  If you have any questions or concerns for your doctor, please call our main line at [phone] and press option 4 to reach your doctor's medical assistant. If no one answers, please leave a voicemail as directed and we will return your call as soon as possible. Messages left after 4 pm will be answered the following business day.   You may also send us a message via MyChart. We typically respond to MyChart messages within 1-2 business days.  For prescription refills, please ask your pharmacy to contact our office. Our fax number is [phone].  If you have an urgent issue when the clinic is closed that cannot wait until the next business day, you can page your doctor at the number below.    Please note that while we do our best to be available for urgent issues outside of office hours, we are not available 24/7.   If you have an urgent issue and are unable to reach us, you may choose to seek medical care at your doctor's office, retail clinic, urgent care center, or emergency room.  If you have a medical emergency, please immediately call 911 or go to the emergency department.  Pager Numbers  - Dr. Kowalski: [phone]  - Dr. Moye: [phone]  - Dr. Stewart:  [phone]  In the event of inclement weather, please call our main line at [phone] for an update on the status of any delays or closures.  Dermatology Medication Tips: Please keep the boxes that topical medications come in in order to help keep track of the instructions about where and how to use these. Pharmacies typically print the medication instructions only on the boxes and not directly on the medication tubes.   If your medication is too expensive, please contact our office at [phone] option 4 or send us a message through MyChart.   We are unable to tell what your co-pay for medications will be in advance as this is different depending on your insurance coverage. However, we may be able to find a substitute medication at lower cost or fill out paperwork to get insurance to cover a needed medication.   If a prior authorization is required to get your medication covered by your insurance company, please allow us 1-2 business days to complete this process.  Drug prices often vary depending on where the prescription is filled and some pharmacies may offer cheaper prices.  The website www.goodrx.com contains coupons for medications through different pharmacies. The prices here do not account for what the cost may be with help from insurance (it may be cheaper with your insurance), but the website can give you the price if you did not use any insurance.  - You can print the associated coupon and take it with   your prescription to the pharmacy.  - You may also stop by our office during regular business hours and pick up a GoodRx coupon card.  - If you need your prescription sent electronically to a different pharmacy, notify our office through [facility] MyChart or by phone at [phone] option 4.     Si Usted Necesita Algo Despus de Su Visita  Tambin puede enviarnos un mensaje a travs de MyChart. Por lo general respondemos a los mensajes de MyChart en el transcurso de 1 a 2  das hbiles.  Para renovar recetas, por favor pida a su farmacia que se ponga en contacto con nuestra oficina. Nuestro nmero de fax es el [phone].  Si tiene un asunto urgente cuando la clnica est cerrada y que no puede esperar hasta el siguiente da hbil, puede llamar/localizar a su doctor(a) al nmero que aparece a continuacin.   Por favor, tenga en cuenta que aunque hacemos todo lo posible para estar disponibles para asuntos urgentes fuera del horario de oficina, no estamos disponibles las 24 horas del da, los 7 das de la semana.   Si tiene un problema urgente y no puede comunicarse con nosotros, puede optar por buscar atencin mdica  en el consultorio de su doctor(a), en una clnica privada, en un centro de atencin urgente o en una sala de emergencias.  Si tiene una emergencia mdica, por favor llame inmediatamente al 911 o vaya a la sala de emergencias.  Nmeros de bper  - Dr. Kowalski: [phone]  - Dra. Moye: [phone]  - Dra. Stewart: [phone]  En caso de inclemencias del tiempo, por favor llame a nuestra lnea principal al [phone] para una actualizacin sobre el estado de cualquier retraso o cierre.  Consejos para la medicacin en dermatologa: Por favor, guarde las cajas en las que vienen los medicamentos de uso tpico para ayudarle a seguir las instrucciones sobre dnde y cmo usarlos. Las farmacias generalmente imprimen las instrucciones del medicamento slo en las cajas y no directamente en los tubos del medicamento.   Si su medicamento es muy caro, por favor, pngase en contacto con nuestra oficina llamando al [phone] y presione la opcin 4 o envenos un mensaje a travs de MyChart.   No podemos decirle cul ser su copago por los medicamentos por adelantado ya que esto es diferente dependiendo de la cobertura de su seguro. Sin embargo, es posible que podamos encontrar un medicamento sustituto a menor costo o llenar un formulario para que el  seguro cubra el medicamento que se considera necesario.   Si se requiere una autorizacin previa para que su compaa de seguros cubra su medicamento, por favor permtanos de 1 a 2 das hbiles para completar este proceso.  Los precios de los medicamentos varan con frecuencia dependiendo del lugar de dnde se surte la receta y alguna farmacias pueden ofrecer precios ms baratos.  El sitio web www.goodrx.com tiene cupones para medicamentos de diferentes farmacias. Los precios aqu no tienen en cuenta lo que podra costar con la ayuda del seguro (puede ser ms barato con su seguro), pero el sitio web puede darle el precio si no utiliz ningn seguro.  - Puede imprimir el cupn correspondiente y llevarlo con su receta a la farmacia.  - Tambin puede pasar por nuestra oficina durante el horario de atencin regular y recoger una tarjeta de cupones de GoodRx.  - Si necesita que su receta se enve electrnicamente a una farmacia diferente, informe a nuestra oficina a travs de MyChart de [facility]   o por telfono llamando al [phone] y presione la opcin 4.  

## 2022-10-30 NOTE — Progress Notes (Signed)
   Follow-Up Visit   Subjective  Logan Wise is a 62 y.o. male who presents for the following: Folliculitis - patient would like Ketoconazole 2% shampoo refilled, he also uses OTC HC leave in treatment which helps.   Hx HSV - needs Rx.  The following portions of the chart were reviewed this encounter and updated as appropriate: medications, allergies, medical history  Review of Systems:  No other skin or systemic complaints except as noted in HPI or Assessment and Plan.  Objective  Well appearing patient in no apparent distress; mood and affect are within normal limits. A focused examination was performed of the following areas: The face and scalp Relevant exam findings are noted in the Assessment and Plan.   Assessment & Plan   FOLLICULITIS/Rosacea/pityrosporum folliculitis of the scalp  Exam: Perifollicular erythematous papules and pustules  Treatment Plan: Continue Ketoconazole 2% shampoo 2-3 days per week. Continue OTC HC leave in treatment QD PRN. Start Clobetasol shampoo 3d/wk let sit 15 minutes before washing out.  Folliculitis  Related Medications ketoconazole (NIZORAL) 2 % shampoo APPLY TOPICALLY TO SCALP 3 TIMES A WEEK. LET SIT 5 MINUTES AND RINSE OUT   Discussed low dose Doxycycline in the future if patient continues to flare.   HSV (herpes simplex virus) infection Lips Cont Valtrex 1g 2 po qt onset of fever blisters, then 2 po 12 hours later Herpes Simplex Virus = Cold Sores = Fever Blisters is a chronic recurring blistering; scabbing sore-producing viral infection that is recurrent usually in the same area triggered by stress, sun/UV exposure and trauma.  It is infectious and can be spread from person to person by direct contact.  It is not curable, but is treatable with topical and oral medication.  Return in about 1 year (around 10/30/2023) for prescription refill.  Maylene Roes, CMA, am acting as scribe for Armida Sans, MD .  Documentation: I have  reviewed the above documentation for accuracy and completeness, and I agree with the above.  Armida Sans, MD

## 2022-11-05 ENCOUNTER — Encounter: Payer: Self-pay | Admitting: Dermatology

## 2022-11-10 ENCOUNTER — Other Ambulatory Visit: Payer: Self-pay | Admitting: Internal Medicine

## 2022-11-10 NOTE — Telephone Encounter (Signed)
LAST APPOINTMENT DATE: 10/14/2022   NEXT APPOINTMENT DATE: Visit date not found    LAST REFILL:10/14/22  QTY: 8.5g w/ no refills

## 2023-01-05 ENCOUNTER — Encounter: Payer: Self-pay | Admitting: Pulmonary Disease

## 2023-01-05 ENCOUNTER — Ambulatory Visit: Payer: 59 | Admitting: Pulmonary Disease

## 2023-01-05 VITALS — BP 126/74 | HR 68 | Temp 97.7°F | Ht 71.25 in | Wt 222.4 lb

## 2023-01-05 DIAGNOSIS — R0602 Shortness of breath: Secondary | ICD-10-CM | POA: Diagnosis not present

## 2023-01-05 DIAGNOSIS — J4530 Mild persistent asthma, uncomplicated: Secondary | ICD-10-CM | POA: Diagnosis not present

## 2023-01-05 NOTE — Progress Notes (Signed)
Subjective:    Patient ID: Logan Wise, male    DOB: 29-Jul-1960, 62 y.o.   MRN: [id number]  Patient Care Team: Karie Schwalbe, MD as PCP - General Pricilla Riffle, MD as PCP - Cardiology (Cardiology) Karie Schwalbe, MD as Referring Physician (Internal Medicine) Lemar Livings Merrily Pew, MD (General Surgery)  Chief Complaint  Patient presents with   Follow-up    Doing well. No current sx.    HPI Logan Wise is a 62 year old remote former smoker with less than 1-pack-year history of smoking who presents for follow-up on the issue of mild persistent asthma.  He was last seen here on 17 July 2022 for the details of that visit please refer to that note.  Recall that at his initial visit he had noted difficulties with shortness of breath for approximately 6 to 7 months mostly when he bends over and after he ate.  He had not noted any wheezing or cough.  He had pulmonary function testing performed on 15 July 2022 which has confirmed that he has reversible obstructive airways consistent with asthma.  The issues that he has some bending over and after he eats were likely related to truncal obesity as noted by his low ERV on PFTs.  He embarked on a weight loss program and has lost 17 pounds.  He notes that his dyspnea has resolved with weight loss and use of Trelegy Ellipta 100 which was started at his last visit here in January.  Since he started Trelegy Ellipta he has only needed rescue inhaler once.  He has not had any fevers, chills or sweats.  No cough or sputum production.  He has not had any dyspnea on exertion nor bendopnea.  No nocturnal awakenings, orthopnea or paroxysmal nocturnal dyspnea.  No lower extremity or calf tenderness he does not endorse any other symptomatology today.    DATA: 07/15/2022 PFTs: FEV1 2.69 L or 70% predicted, FVC 3.  By 7 L or 70% predicted, FEV1/FVC 75%, postbronchodilator there is a 21% improvement on FEV1 and an 18% improvement on FVC indicating reversible  airways component lung volumes show trapping, ERV low system with obesity.  Diffusion capacity normal   Review of Systems A 10 point review of systems was performed and it is as noted above otherwise negative.   Patient Active Problem List   Diagnosis Date Noted   Essential hypertension, benign 10/18/2018   Vertigo 10/15/2016   Preventative health care 08/01/2014   Obesity 04/22/2014   Hypogonadism male 01/04/2013   Hemochromatosis 08/11/2011   Hyperlipidemia    Mood disorder (HCC) 02/02/2008   ALLERGIC RHINITIS 02/02/2008   Allergic asthma 02/02/2008   GERD 02/02/2008   OSTEOARTHRITIS 02/02/2008    Social History   Tobacco Use   Smoking status: Former    Passive exposure: Never   Smokeless tobacco: Never  Substance Use Topics   Alcohol use: Yes    Alcohol/week: 0.0 standard drinks of alcohol    Allergies  Allergen Reactions   Amoxicillin-Pot Clavulanate Anaphylaxis and Hives   Other Anaphylaxis   Peanut Oil Shortness Of Breath and Swelling   Peanut-Containing Drug Products Swelling    Current Meds  Medication Sig   albuterol (VENTOLIN HFA) 108 (90 Base) MCG/ACT inhaler INHALE 2 PUFFS BY MOUTH EVERY 6 HOURS AS NEEDED FOR WHEEZING OR SHORTNESS OF BREATH   cetirizine (ZYRTEC) 10 MG tablet Take 10 mg by mouth daily.   Clobetasol Propionate 0.05 % shampoo Shampoo into scalp let sit 15 minutes  then wash out. Use 3d/wk.   Fluticasone-Umeclidin-Vilant (TRELEGY ELLIPTA) 100-62.5-25 MCG/ACT AEPB Inhale 1 puff into the lungs daily.   ketoconazole (NIZORAL) 2 % shampoo APPLY TOPICALLY TO SCALP 3 TIMES A WEEK. LET SIT 5 MINUTES AND RINSE OUT   tadalafil (CIALIS) 20 MG tablet TAKE 1 TABLET BY MOUTH DAILY AS NEEDED FOR ERECTILE DYSFUNCTION   testosterone cypionate (DEPOTESTOSTERONE CYPIONATE) 200 MG/ML injection INJECT ONE ML INTO THE MUSCLE EVERY SEVEN DAYS   valACYclovir (VALTREX) 1000 MG tablet TAKE 2 TABLETS AT ONSET OF FEVER BLISTERS, THEN 2 TABLETS 12 HOURS LATER    [DISCONTINUED] Fluticasone-Umeclidin-Vilant (TRELEGY ELLIPTA) 100-62.5-25 MCG/ACT AEPB Inhale 1 puff into the lungs daily.    Immunization History  Administered Date(s) Administered   Hepatitis B 08/05/2007, 08/26/2007, 10/15/2007   Influenza Split 03/20/2011, 02/18/2017   Influenza Whole 04/17/2009, 04/09/2010   Influenza,inj,Quad PF,6+ Mos 08/01/2014, 04/01/2016, 04/27/2019   Influenza-Unspecified 05/18/2018   PPD Test 03/04/2011, 01/26/2013, 01/25/2014, 04/06/2015, 04/01/2016, 08/24/2017   Td 06/30/2002   Tdap 07/15/2012   Zoster Recombinant(Shingrix) 04/27/2019        Objective:     BP 126/74 (BP Location: Left Arm, Cuff Size: Normal)   Pulse 68   Temp 97.7 F (36.5 C) (Temporal)   Ht 5' 11.25" (1.81 m)   Wt 222 lb 6.4 oz (100.9 kg)   SpO2 97%   BMI 30.80 kg/m   SpO2: 97 % O2 Device: None (Room air)  GENERAL: Overweight gentleman, no acute distress, fully ambulatory, no conversational dyspnea. HEAD: Normocephalic, atraumatic.  EYES: Pupils equal, round, reactive to light.  No scleral icterus.  MOUTH: Dentition intact, oral mucosa moist. NECK: Supple. No thyromegaly. Trachea midline. No JVD.  No adenopathy. PULMONARY: Good air entry bilaterally.  No adventitious sounds. CARDIOVASCULAR: S1 and S2. Regular rate and rhythm.  No rubs, murmurs or gallops heard. ABDOMEN: Benign. MUSCULOSKELETAL: No joint deformity, no clubbing, no edema.  NEUROLOGIC: No overt focal deficit, no gait disturbance, speech is fluent. SKIN: Intact,warm,dry. PSYCH: Mood and behavior normal.       Assessment & Plan:     ICD-10-CM   1. Mild persistent asthma without complication  J45.30 Pulmonary Function Test [facility] Only   Compensated on Trelegy Ellipta 100 Continue same Continue as needed albuterol    2. SOB (shortness of breath)  R06.02    No issues since he has been on Trelegy      Orders Placed This Encounter  Procedures   Pulmonary Function Test [facility] Only    Standing Status:    Future    Standing Expiration Date:   01/05/2024    Order Specific Question:   Full PFT: includes the following: basic spirometry, spirometry pre & post bronchodilator, diffusion capacity (DLCO), lung volumes    Answer:   Full PFT    Order Specific Question:   This test can only be performed at    Answer:   [facility]   Logan Wise appears to be well compensated.  He is to continue Trelegy and as needed albuterol.  He was commended on his weight loss which has also helped him with his symptoms.  Will see the patient in follow-up in 6 months time he is to contact us prior to that time should any new difficulties arise.   Gailen Shelter, MD Advanced Bronchoscopy PCCM [facility]    *This note was dictated using voice recognition software/Dragon.  Despite best efforts to proofread, errors can occur which can change the meaning. Any transcriptional errors that result  from this process are unintentional and may not be fully corrected at the time of dictation.

## 2023-01-05 NOTE — Patient Instructions (Signed)
Your lungs sound really clear today.  Continue using your Trelegy 1 puff daily.  We will have breathing test scheduled prior to your next appointment.  Will see you in follow-up in 6 months time, sooner should any new problems arise.

## 2023-02-12 ENCOUNTER — Other Ambulatory Visit: Payer: Self-pay | Admitting: Internal Medicine

## 2023-02-12 NOTE — Telephone Encounter (Signed)
Last written 07-16-22 52ml/5 RF CPE 06/2022 No Future OV Karin Golden Sandoval

## 2023-07-13 ENCOUNTER — Encounter: Payer: Self-pay | Admitting: Pulmonary Disease

## 2023-07-13 NOTE — Telephone Encounter (Signed)
 Because of his issue being asthma the Trelegy is his best bet.  Arnuity may work but it only has 1 medication as opposed to the Trelegy which is 3 medications and 1.  We do have Trelegy coupons that we can provide him with.

## 2023-08-12 ENCOUNTER — Other Ambulatory Visit: Payer: Self-pay | Admitting: Pulmonary Disease

## 2023-08-12 ENCOUNTER — Encounter: Payer: Self-pay | Admitting: Pulmonary Disease

## 2023-08-12 ENCOUNTER — Ambulatory Visit: Payer: 59 | Admitting: Pulmonary Disease

## 2023-08-12 VITALS — BP 138/84 | HR 75 | Temp 97.1°F | Ht 71.25 in | Wt 205.8 lb

## 2023-08-12 DIAGNOSIS — J4530 Mild persistent asthma, uncomplicated: Secondary | ICD-10-CM | POA: Diagnosis not present

## 2023-08-12 LAB — NITRIC OXIDE: Nitric Oxide: 12

## 2023-08-12 NOTE — Patient Instructions (Signed)
VISIT SUMMARY:  Logan Wise, a 63 year old male with asthma, came in for a follow-up appointment. He reported excellent control of his asthma symptoms since starting Trelegy, with significant improvement from day one and only three uses of his rescue inhaler in the past year. He also mentioned a weight loss of thirty-five to forty pounds, which he believes has positively impacted his asthma management.  YOUR PLAN:  -ASTHMA: Asthma is a condition where your airways narrow and swell, making it difficult to breathe. Your asthma is well-controlled with Trelegy, and you have only needed your rescue inhaler three times in the past year. Your type two inflammation level is down to twelve, which is a good sign. Continue taking Trelegy as prescribed and use albuterol only when needed. Your weight loss of thirty-five to forty pounds is also helping manage your asthma. We will follow up in six months to monitor your progress.  INSTRUCTIONS:  Please schedule a follow-up appointment in six months to monitor your asthma management and overall health.

## 2023-08-12 NOTE — Progress Notes (Signed)
 Subjective:    Patient ID: Logan Wise, male    DOB: 1961-02-16, 63 y.o.   MRN: [id number]  Patient Care Team: Karie Schwalbe, MD as PCP - General Pricilla Riffle, MD as PCP - Cardiology (Cardiology) Karie Schwalbe, MD as Referring Physician (Internal Medicine) Lemar Livings Merrily Pew, MD (General Surgery)  Chief Complaint  Patient presents with   Follow-up    No SOB, wheezing or cough.     BACKGROUND/INTERVAL: Logan Wise is a 63 year old remote former smoker with less than 1-pack-year history of smoking who presents for follow-up on the issue of mild persistent asthma.  He was last seen here on 05 January 2023.  No exacerbations since his last visit.  HPI Discussed the use of AI scribe software for clinical note transcription with the patient, who gave verbal consent to proceed.  History of Present Illness   Logan ERHART "Logan Wise" is a 63 year old male with asthma who presents for follow-up.  He has experienced excellent control of his asthma symptoms since starting Trelegy, describing the medication as 'amazing' with significant improvement from day one. He has only needed to use his rescue inhaler three times in the past year, indicating a substantial reduction in asthma exacerbations.  He mentions a weight loss of thirty-five to forty pounds, which he believes has contributed positively to his asthma management.  He works in [employer] se[employer], which may involve varying levels of physical activity.    DATA: 07/15/2022 PFTs: FEV1 2.69 L or 70% predicted, FVC 3.  By 7 L or 70% predicted, FEV1/FVC 75%, postbronchodilator there is a 21% improvement on FEV1 and an 18% improvement on FVC indicating reversible airways component lung volumes show trapping, ERV low consistent with obesity.  Diffusion capacity normal  Review of Systems A 10 point review of systems was performed and it is as noted above otherwise negative.   Patient Active Problem List   Diagnosis Date Noted    Essential hypertension, benign 10/18/2018   Vertigo 10/15/2016   Preventative health care 08/01/2014   Obesity 04/22/2014   Hypogonadism male 01/04/2013   Hemochromatosis 08/11/2011   Hyperlipidemia    Mood disorder (HCC) 02/02/2008   Allergic rhinitis 02/02/2008   Allergic asthma 02/02/2008   GERD 02/02/2008   Osteoarthritis 02/02/2008    Social History   Tobacco Use   Smoking status: Former    Passive exposure: Never   Smokeless tobacco: Never  Substance Use Topics   Alcohol use: Yes    Alcohol/week: 0.0 standard drinks of alcohol    Allergies  Allergen Reactions   Amoxicillin-Pot Clavulanate Anaphylaxis and Hives   Other Anaphylaxis   Peanut Oil Shortness Of Breath and Swelling   Peanut-Containing Drug Products Swelling    Current Meds  Medication Sig   albuterol (VENTOLIN HFA) 108 (90 Base) MCG/ACT inhaler INHALE 2 PUFFS BY MOUTH EVERY 6 HOURS AS NEEDED FOR WHEEZING OR SHORTNESS OF BREATH   cetirizine (ZYRTEC) 10 MG tablet Take 10 mg by mouth daily.   Clobetasol Propionate 0.05 % shampoo Shampoo into scalp let sit 15 minutes then wash out. Use 3d/wk.   Fluticasone-Umeclidin-Vilant (TRELEGY ELLIPTA) 100-62.5-25 MCG/ACT AEPB Inhale 1 puff into the lungs daily.   ketoconazole (NIZORAL) 2 % shampoo APPLY TOPICALLY TO SCALP 3 TIMES A WEEK. LET SIT 5 MINUTES AND RINSE OUT   tadalafil (CIALIS) 20 MG tablet TAKE 1 TABLET BY MOUTH DAILY AS NEEDED FOR ERECTILE DYSFUNCTION   testosterone cypionate (DEPOTESTOSTERONE CYPIONATE) 200 MG/ML injection INJECT 1  ML INTO THE MUSCLE EVERY 7 DAYS   tirzepatide (MOUNJARO) 2.5 MG/0.5ML Pen Inject 2.5 mg into the skin once a week.   triamcinolone cream (KENALOG) 0.1 % Apply 1 Application topically daily.   valACYclovir (VALTREX) 1000 MG tablet TAKE 2 TABLETS AT ONSET OF FEVER BLISTERS, THEN 2 TABLETS 12 HOURS LATER    Immunization History  Administered Date(s) Administered   Hepatitis B 08/05/2007, 08/26/2007, 10/15/2007   Influenza  Split 03/20/2011, 02/18/2017   Influenza Whole 04/17/2009, 04/09/2010   Influenza,inj,Quad PF,6+ Mos 08/01/2014, 04/01/2016, 04/27/2019   Influenza-Unspecified 05/18/2018   PPD Test 03/04/2011, 01/26/2013, 01/25/2014, 04/06/2015, 04/01/2016, 08/24/2017   Td 06/30/2002   Tdap 07/15/2012   Zoster Recombinant(Shingrix) 04/27/2019        Objective:     BP 138/84 (BP Location: Right Arm, Cuff Size: Large)   Pulse 75   Temp (!) 97.1 F (36.2 C)   Ht 5' 11.25" (1.81 m)   Wt 205 lb 12.8 oz (93.4 kg)   SpO2 98%   BMI 28.50 kg/m   SpO2: 98 % O2 Device: None (Room air)  GENERAL: Well-developed, well-nourished gentleman, no acute distress, fully ambulatory, no conversational dyspnea. HEAD: Normocephalic, atraumatic.  EYES: Pupils equal, round, reactive to light.  No scleral icterus.  MOUTH: Dentition intact, oral mucosa moist. NECK: Supple. No thyromegaly. Trachea midline. No JVD.  No adenopathy. PULMONARY: Good air entry bilaterally.  No adventitious sounds. CARDIOVASCULAR: S1 and S2. Regular rate and rhythm.  No rubs, murmurs or gallops heard. ABDOMEN: Benign. MUSCULOSKELETAL: No joint deformity, no clubbing, no edema.  NEUROLOGIC: No overt focal deficit, no gait disturbance, speech is fluent. SKIN: Intact,warm,dry. PSYCH: Mood and behavior normal.    Lab Results  Component Value Date   NITRICOXIDE 12 08/12/2023      Assessment & Plan:     ICD-10-CM   1. Mild persistent asthma without complication  J45.30 Nitric oxide   Previously moderate now mild Well-controlled with Trelegy Ellipta      Orders Placed This Encounter  Procedures   Nitric oxide   Discussion:     Asthma Asthma is well-controlled on Trelegy. He reports significant improvement with only three uses of albuterol in the past year. Type II inflammation level is twelve, indicating good control. He has also lost thirty-five to forty pounds, which is beneficial for asthma management. No significant side  effects reported.  We discussed scaling down therapy to AirSupra as needed however patient would like to continue current therapy as he is well-controlled. - Continue Trelegy - Use albuterol as needed - Follow-up in six months.      Advised if symptoms do not improve or worsen, to please contact office for sooner follow up or seek emergency care.    I spent 30 minutes of dedicated to the care of this patient on the date of this encounter to include pre-visit review of records, face-to-face time with the patient discussing conditions above, post visit ordering of testing, clinical documentation with the electronic health record, making appropriate referrals as documented, and communicating necessary findings to members of the patients care team.     C. Danice Goltz, MD Advanced Bronchoscopy PCCM [facility]    *This note was generated using voice recognition software/Dragon and/or AI transcription program.  Despite best efforts to proofread, errors can occur which can change the meaning. Any transcriptional errors that result from this process are unintentional and may not be fully corrected at the time of dictation.

## 2023-09-16 ENCOUNTER — Other Ambulatory Visit: Payer: Self-pay | Admitting: Internal Medicine

## 2023-09-29 ENCOUNTER — Ambulatory Visit: Payer: Self-pay | Admitting: Allergy and Immunology

## 2023-10-13 ENCOUNTER — Ambulatory Visit: Payer: Self-pay | Admitting: Allergy and Immunology

## 2023-10-29 ENCOUNTER — Ambulatory Visit: Payer: 59 | Admitting: Dermatology

## 2023-11-10 ENCOUNTER — Ambulatory Visit: Payer: Self-pay | Admitting: Allergy and Immunology

## 2023-12-17 ENCOUNTER — Encounter: Payer: Self-pay | Admitting: Dermatology

## 2023-12-17 ENCOUNTER — Ambulatory Visit: Admitting: Dermatology

## 2023-12-17 DIAGNOSIS — L578 Other skin changes due to chronic exposure to nonionizing radiation: Secondary | ICD-10-CM

## 2023-12-17 DIAGNOSIS — W908XXA Exposure to other nonionizing radiation, initial encounter: Secondary | ICD-10-CM | POA: Diagnosis not present

## 2023-12-17 DIAGNOSIS — L821 Other seborrheic keratosis: Secondary | ICD-10-CM

## 2023-12-17 DIAGNOSIS — L820 Inflamed seborrheic keratosis: Secondary | ICD-10-CM | POA: Diagnosis not present

## 2023-12-17 NOTE — Patient Instructions (Addendum)
 Recommend starting OTC Voltaren, apply once a day and rub into remaining spots on face to spot treat. May take several to many weeks/months.   Due to recent changes in healthcare laws, you may see results of your pathology and/or laboratory studies on MyChart before the doctors have had a chance to review them. We understand that in some cases there may be results that are confusing or concerning to you. Please understand that not all results are received at the same time and often the doctors may need to interpret multiple results in order to provide you with the best plan of care or course of treatment. Therefore, we ask that you please give us  2 business days to thoroughly review all your results before contacting the office for clarification. Should we see a critical lab result, you will be contacted sooner.   If You Need Anything After Your Visit  If you have any questions or concerns for your doctor, please call our main line at [phone] and press option 4 to reach your doctor's medical assistant. If no one answers, please leave a voicemail as directed and we will return your call as soon as possible. Messages left after 4 pm will be answered the following business day.   You may also send us  a message via MyChart. We typically respond to MyChart messages within 1-2 business days.  For prescription refills, please ask your pharmacy to contact our office. Our fax number is [phone].  If you have an urgent issue when the clinic is closed that cannot wait until the next business day, you can page your doctor at the number below.    Please note that while we do our best to be available for urgent issues outside of office hours, we are not available 24/7.   If you have an urgent issue and are unable to reach us , you may choose to seek medical care at your doctor's office, retail clinic, urgent care center, or emergency room.  If you have a medical emergency, please immediately call 911 or  go to the emergency department.  Pager Numbers  - Dr. Bary Likes: [phone]  - Dr. Annette Barters: [phone]  - Dr. Felipe Horton: [phone]   In the event of inclement weather, please call our main line at [phone] for an update on the status of any delays or closures.  Dermatology Medication Tips: Please keep the boxes that topical medications come in in order to help keep track of the instructions about where and how to use these. Pharmacies typically print the medication instructions only on the boxes and not directly on the medication tubes.   If your medication is too expensive, please contact our office at [phone] option 4 or send us  a message through MyChart.   We are unable to tell what your co-pay for medications will be in advance as this is different depending on your insurance coverage. However, we may be able to find a substitute medication at lower cost or fill out paperwork to get insurance to cover a needed medication.   If a prior authorization is required to get your medication covered by your insurance company, please allow us  1-2 business days to complete this process.  Drug prices often vary depending on where the prescription is filled and some pharmacies may offer cheaper prices.  The website www.goodrx.com contains coupons for medications through different pharmacies. The prices here do not account for what the cost may be with help from insurance (it may be cheaper with your insurance),  but the website can give you the price if you did not use any insurance.  - You can print the associated coupon and take it with your prescription to the pharmacy.  - You may also stop by our office during regular business hours and pick up a GoodRx coupon card.  - If you need your prescription sent electronically to a different pharmacy, notify our office through [facility] or by phone at [phone] option 4.     Si Usted Necesita Algo Despus de Su  Visita  Tambin puede enviarnos un mensaje a travs de Clinical cytogeneticist. Por lo general respondemos a los mensajes de MyChart en el transcurso de 1 a 2 das hbiles.  Para renovar recetas, por favor pida a su farmacia que se ponga en contacto con nuestra oficina. Franz Jacks de fax es [place] [phone].  Si tiene un asunto urgente cuando la clnica est cerrada y que no puede esperar hasta el siguiente da hbil, puede llamar/localizar a su doctor(a) al nmero que aparece a continuacin.   Por favor, tenga en cuenta que aunque hacemos todo lo posible para estar disponibles para asuntos urgentes fuera del horario de [place], no estamos disponibles las 24 horas del da, los 7 [address]  [address] de la [place].   Si tiene un problema urgente y no puede comunicarse con nosotros, puede optar por buscar atencin mdica  en el consultorio de su doctor(a), en una clnica privada, en un centro de atencin urgente o en una sala de emergencias.  Si tiene Engineer, drilling, por favor llame inmediatamente al 911 o vaya a la sala de emergencias.  Nmeros de bper  - Dr. Bary Likes: [phone]  - Dra. Annette Barters: [phone]  - Dr. Felipe Horton: [phone]   En caso de inclemencias del tiempo, por favor llame a Lajuan Pila principal al [phone] para una actualizacin sobre el [place] de cualquier retraso o cierre.  Consejos para la medicacin en dermatologa: Por favor, guarde las cajas en las que vienen los medicamentos de uso tpico para ayudarle a seguir las instrucciones sobre dnde y cmo usarlos. Las farmacias generalmente imprimen las instrucciones del medicamento slo en las cajas y no directamente en los tubos del [place].   Si su medicamento es muy caro, por favor, pngase en contacto con Bettyjane Brunet llamando al [phone] y presione la opcin 4 o envenos un mensaje a travs de Clinical cytogeneticist.   No podemos decirle cul ser su copago por los medicamentos por adelantado ya que esto es diferente dependiendo de  la cobertura de su seguro. Sin embargo, es posible que podamos encontrar un medicamento sustituto a Audiological scientist un formulario para que el seguro cubra el medicamento que se considera necesario.   Si se requiere una autorizacin previa para que su compaa de seguros [place] su medicamento, por favor permtanos de 1 a 2 das hbiles para completar este proceso.  Los precios de los medicamentos varan con frecuencia dependiendo del Environmental consultant de dnde se surte la receta y alguna farmacias pueden ofrecer precios ms baratos.  El sitio web www.goodrx.com tiene cupones para medicamentos de Health and safety inspector. Los precios aqu no tienen en cuenta lo que podra costar con la ayuda del seguro (puede ser ms barato con su seguro), pero el sitio web puede darle el precio si no utiliz Tourist information centre manager.  - Puede imprimir el cupn correspondiente y llevarlo con su receta a la farmacia.  - Tambin puede pasar por nuestra oficina durante el horario de atencin regular y Education officer, museum una tarjeta de  cupones de GoodRx.  - Si necesita que su receta se enve electrnicamente a una farmacia diferente, informe a nuestra oficina a travs de MyChart de [place] o por telfono llamando al [phone] y presione la opcin 4.

## 2023-12-17 NOTE — Progress Notes (Signed)
   Follow-Up Visit   Subjective  Logan Wise is a 63 y.o. male who presents for the following: place at L cheek, itched, formed a scab and it was raised appeared x1-2 months ago.   The patient has spots, moles and lesions to be evaluated, some may be new or changing and the patient may have concern these could be cancer.  The following portions of the chart were reviewed this encounter and updated as appropriate: medications, allergies, medical history  Review of Systems:  No other skin or systemic complaints except as noted in HPI or Assessment and Plan.  Objective  Well appearing patient in no apparent distress; mood and affect are within normal limits.  A focused examination was performed of the following areas: Face Relevant exam findings are noted in the Assessment and Plan.  Left Preauricular Area x1 Stuck on waxy paps with erythema  Assessment & Plan   ACTINIC DAMAGE - chronic, secondary to cumulative UV radiation exposure/sun exposure over time - diffuse scaly erythematous macules with underlying dyspigmentation - Recommend daily broad spectrum sunscreen SPF 30+ to sun-exposed areas, reapply every 2 hours as needed.  - Recommend staying in the shade or wearing long sleeves, sun glasses (UVA+UVB protection) and wide brim hats (4-inch brim around the entire circumference of the hat). - Call for new or changing lesions.  SEBORRHEIC KERATOSIS - Stuck-on, waxy, tan-brown papules and/or plaques  - Benign-appearing - Discussed benign etiology and prognosis. - Observe - Call for any changes - Recommend starting OTC Voltaren cream, apply once a day and rub into remaining spots on face to spot treat. May take several to many weeks/months. May also use topical retinoids; salicylic acid or Urea cream 10-40%  INFLAMED SEBORRHEIC KERATOSIS Left Preauricular Area x1 Symptomatic, irritating, patient would like treated. Destruction of lesion - Left Preauricular Area  x1 Complexity: simple   Destruction method: cryotherapy   Informed consent: discussed and consent obtained   Timeout:  patient name, date of birth, surgical site, and procedure verified Lesion destroyed using liquid nitrogen: Yes   Region frozen until ice ball extended beyond lesion: Yes   Outcome: patient tolerated procedure well with no complications   Post-procedure details: wound care instructions given    Return if symptoms worsen or fail to improve, for w/ Dr. Bary Likes.  I, Jacquelynn V. Grier Leber, CMA, am acting as scribe for Celine Collard, MD   Documentation: I have reviewed the above documentation for accuracy and completeness, and I agree with the above.  Celine Collard, MD

## 2024-03-25 ENCOUNTER — Other Ambulatory Visit (HOSPITAL_BASED_OUTPATIENT_CLINIC_OR_DEPARTMENT_OTHER): Payer: Self-pay | Admitting: Orthopedic Surgery

## 2024-03-25 DIAGNOSIS — M19012 Primary osteoarthritis, left shoulder: Secondary | ICD-10-CM

## 2024-03-26 ENCOUNTER — Ambulatory Visit (HOSPITAL_BASED_OUTPATIENT_CLINIC_OR_DEPARTMENT_OTHER)
Admission: RE | Admit: 2024-03-26 | Discharge: 2024-03-26 | Disposition: A | Discharge status: Home / Self Care | Source: Ambulatory Visit | Attending: Orthopedic Surgery | Admitting: Orthopedic Surgery

## 2024-03-26 DIAGNOSIS — M19012 Primary osteoarthritis, left shoulder: Secondary | ICD-10-CM | POA: Diagnosis present

## 2024-03-29 ENCOUNTER — Encounter: Payer: Self-pay | Admitting: Dermatology

## 2024-03-29 ENCOUNTER — Ambulatory Visit: Admitting: Dermatology

## 2024-03-29 DIAGNOSIS — Z7189 Other specified counseling: Secondary | ICD-10-CM

## 2024-03-29 DIAGNOSIS — L299 Pruritus, unspecified: Secondary | ICD-10-CM

## 2024-03-29 DIAGNOSIS — T07XXXA Unspecified multiple injuries, initial encounter: Secondary | ICD-10-CM

## 2024-03-29 DIAGNOSIS — Z79899 Other long term (current) drug therapy: Secondary | ICD-10-CM

## 2024-03-29 DIAGNOSIS — L209 Atopic dermatitis, unspecified: Secondary | ICD-10-CM | POA: Diagnosis not present

## 2024-03-29 MED ORDER — DUPIXENT 300 MG/2ML ~~LOC~~ SOAJ: 300.0000 mg | SUBCUTANEOUS | 5 refills | Status: AC

## 2024-03-29 MED ORDER — DUPILUMAB 300 MG/2ML ~~LOC~~ SOAJ
600.0000 mg | Freq: Once | SUBCUTANEOUS | Status: AC
  Administered 2024-03-29: 600 mg via SUBCUTANEOUS

## 2024-03-29 NOTE — Progress Notes (Unsigned)
   Follow-Up Visit   Subjective  Logan Wise is a 63 y.o. male who presents for the following: itching. Scattered body. Dur: couple of years. Wakes him up at night itching.   The following portions of the chart were reviewed this encounter and updated as appropriate: medications, allergies, medical history  Review of Systems:  No other skin or systemic complaints except as noted in HPI or Assessment and Plan.  Objective  Well appearing patient in no apparent distress; mood and affect are within normal limits.  A focused examination was performed of the following areas: Scalp, face, neck, ears, arms, torso  Relevant exam findings are noted in the Assessment and Plan.     Assessment & Plan   ATOPIC DERMATITIS With Severe Pruritus and Excoriations for many months  Waking up at night itching History of seasonal allergies and peanut allergy Not improved with oral Antihistamines Exam: Scattered erythematous excoriations at upper back. +Dermatographism.  35% BSA Chronic and persistent condition with duration or expected duration over one year. Condition is bothersome/symptomatic for patient. Currently flared. Atopic dermatitis (eczema) is a chronic, relapsing, pruritic condition that can significantly affect quality of life. It is often associated with allergic rhinitis and/or asthma and can require treatment with topical medications, phototherapy, or in severe cases biologic injectable medication (Dupixent; Adbry) or Oral JAK inhibitors.  Treatment Plan: Recommend starting Dupixent 300 mg/2mL SQ QOW. Dupilumab (Dupixent) is a treatment given by injection for adults and children with moderate-to-severe atopic dermatitis. Goal is control of skin condition, not cure. It is given as 2 injections at the first dose followed by 1 injection every 2 weeks thereafter.  Young children are dosed monthly.   Dupixent 300mg /22mL x 2 injected SQ into the B/L thighs. Patient tolerated injections  well.  NDC: 9975-4084-79 Lot: [id number] Exp: 05/29/2026   Potential side effects include allergic reaction, herpes infections, injection site reactions and conjunctivitis (inflammation of the eyes).  The use of Dupixent requires long term medication management, including periodic office visits.  Recommend gentle skin care.   ATOPIC DERMATITIS, UNSPECIFIED TYPE   Related Medications Dupilumab SOAJ 600 mg   Return in about 2 weeks (around 04/12/2024) for Atopic Dermatitis Follow Up, Dupixent Follow Up.  I, Jill Parcell, CMA, am acting as scribe for Alm Rhyme, MD.   Documentation: I have reviewed the above documentation for accuracy and completeness, and I agree with the above.  Alm Rhyme, MD

## 2024-03-29 NOTE — Patient Instructions (Signed)
 Dupilumab (Dupixent) is a biologic treatment given by injection for adults and children with moderate-to-severe atopic dermatitis. Goal is control of skin condition, not cure. It is given as 2 injections at the first dose followed by 1 injection every 2 weeks thereafter.  Young children are dosed monthly.  Potential side effects include allergic reaction, herpes infections, injection site reactions and conjunctivitis (inflammation of the eyes).  The use of Dupixent requires long term medication management, including periodic office visits.    Gentle Skin Care Guide  1. Bathe no more than once a day.  2. Avoid bathing in hot water  3. Use a mild soap like Dove, Vanicream, Cetaphil, CeraVe. Can use Lever 2000 or Cetaphil antibacterial soap  4. Use soap only where you need it. On most days, use it under your arms, between your legs, and on your feet. Let the water rinse other areas unless visibly dirty.  5. When you get out of the bath/shower, use a towel to gently blot your skin dry, don't rub it.  6. While your skin is still a little damp, apply a moisturizing cream such as Vanicream, CeraVe, Cetaphil, Eucerin, Sarna lotion or plain Vaseline Jelly. For hands apply Neutrogena [place] Hand Cream or Excipial Hand Cream.  7. Reapply moisturizer any time you start to itch or feel dry.  8. Sometimes using free and clear laundry detergents can be helpful. Fabric softener sheets should be avoided. Downy Free & Gentle liquid, or any liquid fabric softener that is free of dyes and perfumes, it acceptable to use  9. If your doctor has given you prescription creams you may apply moisturizers over them       Due to recent changes in healthcare laws, you may see results of your pathology and/or laboratory studies on MyChart before the doctors have had a chance to review them. We understand that in some cases there may be results that are confusing or concerning to you. Please understand that not all  results are received at the same time and often the doctors may need to interpret multiple results in order to provide you with the best plan of care or course of treatment. Therefore, we ask that you please give us  2 business days to thoroughly review all your results before contacting the office for clarification. Should we see a critical lab result, you will be contacted sooner.   If You Need Anything After Your Visit  If you have any questions or concerns for your doctor, please call our main line at [phone] and press option 4 to reach your doctor's medical assistant. If no one answers, please leave a voicemail as directed and we will return your call as soon as possible. Messages left after 4 pm will be answered the following business day.   You may also send us  a message via MyChart. We typically respond to MyChart messages within 1-2 business days.  For prescription refills, please ask your pharmacy to contact our office. Our fax number is [phone].  If you have an urgent issue when the clinic is closed that cannot wait until the next business day, you can page your doctor at the number below.    Please note that while we do our best to be available for urgent issues outside of office hours, we are not available 24/7.   If you have an urgent issue and are unable to reach us , you may choose to seek medical care at your doctor's office, retail clinic, urgent care center, or  emergency room.  If you have a medical emergency, please immediately call 911 or go to the emergency department.  Pager Numbers  - Dr. Hester: [phone]  - Dr. Jackquline: [phone]  - Dr. Claudene: [phone]   - Dr. Raymund: [phone]  In the event of inclement weather, please call our main line at [phone] for an update on the status of any delays or closures.  Dermatology Medication Tips: Please keep the boxes that topical medications come in in order to help keep track of the  instructions about where and how to use these. Pharmacies typically print the medication instructions only on the boxes and not directly on the medication tubes.   If your medication is too expensive, please contact our office at [phone] option 4 or send us  a message through MyChart.   We are unable to tell what your co-pay for medications will be in advance as this is different depending on your insurance coverage. However, we may be able to find a substitute medication at lower cost or fill out paperwork to get insurance to cover a needed medication.   If a prior authorization is required to get your medication covered by your insurance company, please allow us  1-2 business days to complete this process.  Drug prices often vary depending on where the prescription is filled and some pharmacies may offer cheaper prices.  The website www.goodrx.com contains coupons for medications through different pharmacies. The prices here do not account for what the cost may be with help from insurance (it may be cheaper with your insurance), but the website can give you the price if you did not use any insurance.  - You can print the associated coupon and take it with your prescription to the pharmacy.  - You may also stop by our office during regular business hours and pick up a GoodRx coupon card.  - If you need your prescription sent electronically to a different pharmacy, notify our office through [facility] or by phone at [phone] option 4.     Si Usted Necesita Algo Despus de Su Visita  Tambin puede enviarnos un mensaje a travs de Clinical cytogeneticist. Por lo general respondemos a los mensajes de MyChart en el transcurso de 1 a 2 das hbiles.  Para renovar recetas, por favor pida a su farmacia que se ponga en contacto con nuestra oficina. Randi lakes de fax es [place] [phone].  Si tiene un asunto urgente cuando la clnica est cerrada y que no puede esperar hasta el siguiente da hbil,  puede llamar/localizar a su doctor(a) al nmero que aparece a continuacin.   Por favor, tenga en cuenta que aunque hacemos todo lo posible para estar disponibles para asuntos urgentes fuera del horario de [place], no estamos disponibles las 24 horas del da, los 7 [address]  [address] de la [place].   Si tiene un problema urgente y no puede comunicarse con nosotros, puede optar por buscar atencin mdica  en el consultorio de su doctor(a), en una clnica privada, en un centro de atencin urgente o en una sala de emergencias.  Si tiene Engineer, drilling, por favor llame inmediatamente al 911 o vaya a la sala de emergencias.  Nmeros de bper  - Dr. Hester: [phone]  - Dra. Jackquline: [phone]  - Dr. Claudene: [phone]  - Dra. Kitts: [phone]  En caso de inclemencias del [place], por favor llame a nuestra lnea principal al [phone] para una actualizacin sobre el estado de cualquier retraso o cierre.  Consejos para la medicacin  en dermatologa: Por favor, guarde las cajas en las que vienen los medicamentos de uso tpico para ayudarle a seguir las instrucciones sobre dnde y cmo usarlos. Las farmacias generalmente imprimen las instrucciones del medicamento slo en las cajas y no directamente en los tubos del [place].   Si su medicamento es muy caro, por favor, pngase en contacto con landry rieger llamando al [phone] y presione la opcin 4 o envenos un mensaje a travs de Clinical cytogeneticist.   No podemos decirle cul ser su copago por los medicamentos por adelantado ya que esto es diferente dependiendo de la cobertura de su seguro. Sin embargo, es posible que podamos encontrar un medicamento sustituto a Audiological scientist un formulario para que el seguro cubra el medicamento que se considera necesario.   Si se requiere una autorizacin previa para que su compaa de seguros malta su medicamento, por favor permtanos de 1 a 2 das hbiles para completar este proceso.  Los precios  de los medicamentos varan con frecuencia dependiendo del Environmental consultant de dnde se surte la receta y alguna farmacias pueden ofrecer precios ms baratos.  El sitio web www.goodrx.com tiene cupones para medicamentos de Health and safety inspector. Los precios aqu no tienen en cuenta lo que podra costar con la ayuda del seguro (puede ser ms barato con su seguro), pero el sitio web puede darle el precio si no utiliz Tourist information centre manager.  - Puede imprimir el cupn correspondiente y llevarlo con su receta a la farmacia.  - Tambin puede pasar por nuestra oficina durante el horario de atencin regular y Education officer, museum una tarjeta de cupones de GoodRx.  - Si necesita que su receta se enve electrnicamente a una farmacia diferente, informe a nuestra oficina a travs de MyChart de [place] o por telfono llamando al [phone] y presione la opcin 4.

## 2024-03-30 ENCOUNTER — Encounter: Payer: Self-pay | Admitting: Dermatology

## 2024-04-12 ENCOUNTER — Ambulatory Visit: Admitting: Dermatology

## 2024-04-12 ENCOUNTER — Encounter: Payer: Self-pay | Admitting: Dermatology

## 2024-04-12 DIAGNOSIS — L209 Atopic dermatitis, unspecified: Secondary | ICD-10-CM

## 2024-04-12 DIAGNOSIS — W908XXA Exposure to other nonionizing radiation, initial encounter: Secondary | ICD-10-CM

## 2024-04-12 DIAGNOSIS — L821 Other seborrheic keratosis: Secondary | ICD-10-CM | POA: Diagnosis not present

## 2024-04-12 DIAGNOSIS — L578 Other skin changes due to chronic exposure to nonionizing radiation: Secondary | ICD-10-CM | POA: Diagnosis not present

## 2024-04-12 DIAGNOSIS — Z79899 Other long term (current) drug therapy: Secondary | ICD-10-CM

## 2024-04-12 DIAGNOSIS — L820 Inflamed seborrheic keratosis: Secondary | ICD-10-CM | POA: Diagnosis not present

## 2024-04-12 DIAGNOSIS — Z7189 Other specified counseling: Secondary | ICD-10-CM

## 2024-04-12 DIAGNOSIS — L299 Pruritus, unspecified: Secondary | ICD-10-CM | POA: Diagnosis not present

## 2024-04-12 MED ORDER — DUPILUMAB 300 MG/2ML ~~LOC~~ SOSY
300.0000 mg | PREFILLED_SYRINGE | Freq: Once | SUBCUTANEOUS | Status: AC
  Administered 2024-04-12: 300 mg via SUBCUTANEOUS

## 2024-04-12 NOTE — Patient Instructions (Addendum)
 Dupilumab (Dupixent) is a biologic treatment given by injection for adults and children with moderate-to-severe atopic dermatitis. Goal is control of skin condition, not cure. It is given as 2 injections at the first dose followed by 1 injection every 2 weeks thereafter.  Young children are dosed monthly.  Potential side effects include allergic reaction, herpes infections, injection site reactions and conjunctivitis (inflammation of the eyes).  The use of Dupixent requires long term medication management, including periodic office visits.    Cryotherapy Aftercare  Wash gently with soap and water everyday.   Apply Vaseline and Band-Aid daily until healed.   Seborrheic Keratosis  What causes seborrheic keratoses? Seborrheic keratoses are harmless, common skin growths that first appear during adult life.  As time goes by, more growths appear.  Some people may develop a large number of them.  Seborrheic keratoses appear on both covered and uncovered body parts.  They are not caused by sunlight.  The tendency to develop seborrheic keratoses can be inherited.  They vary in color from skin-colored to gray, brown, or even black.  They can be either smooth or have a rough, warty surface.   Seborrheic keratoses are superficial and look as if they were stuck on the skin.  Under the microscope this type of keratosis looks like layers upon layers of skin.  That is why at times the top layer may seem to fall off, but the rest of the growth remains and re-grows.    Treatment Seborrheic keratoses do not need to be treated, but can easily be removed in the office.  Seborrheic keratoses often cause symptoms when they rub on clothing or jewelry.  Lesions can be in the way of shaving.  If they become inflamed, they can cause itching, soreness, or burning.  Removal of a seborrheic keratosis can be accomplished by freezing, burning, or surgery. If any spot bleeds, scabs, or grows rapidly, please return to have it  checked, as these can be an indication of a skin cancer.   Due to recent changes in healthcare laws, you may see results of your pathology and/or laboratory studies on MyChart before the doctors have had a chance to review them. We understand that in some cases there may be results that are confusing or concerning to you. Please understand that not all results are received at the same time and often the doctors may need to interpret multiple results in order to provide you with the best plan of care or course of treatment. Therefore, we ask that you please give us  2 business days to thoroughly review all your results before contacting the office for clarification. Should we see a critical lab result, you will be contacted sooner.   If You Need Anything After Your Visit  If you have any questions or concerns for your doctor, please call our main line at [phone] and press option 4 to reach your doctor's medical assistant. If no one answers, please leave a voicemail as directed and we will return your call as soon as possible. Messages left after 4 pm will be answered the following business day.   You may also send us  a message via MyChart. We typically respond to MyChart messages within 1-2 business days.  For prescription refills, please ask your pharmacy to contact our office. Our fax number is [phone].  If you have an urgent issue when the clinic is closed that cannot wait until the next business day, you can page your doctor at the number below.  Please note that while we do our best to be available for urgent issues outside of office hours, we are not available 24/7.   If you have an urgent issue and are unable to reach us , you may choose to seek medical care at your doctor's office, retail clinic, urgent care center, or emergency room.  If you have a medical emergency, please immediately call 911 or go to the emergency department.  Pager Numbers  - Dr. Hester:  [phone]  - Dr. Jackquline: [phone]  - Dr. Claudene: [phone]   - Dr. Raymund: [phone]  In the event of inclement weather, please call our main line at [phone] for an update on the status of any delays or closures.  Dermatology Medication Tips: Please keep the boxes that topical medications come in in order to help keep track of the instructions about where and how to use these. Pharmacies typically print the medication instructions only on the boxes and not directly on the medication tubes.   If your medication is too expensive, please contact our office at [phone] option 4 or send us  a message through MyChart.   We are unable to tell what your co-pay for medications will be in advance as this is different depending on your insurance coverage. However, we may be able to find a substitute medication at lower cost or fill out paperwork to get insurance to cover a needed medication.   If a prior authorization is required to get your medication covered by your insurance company, please allow us  1-2 business days to complete this process.  Drug prices often vary depending on where the prescription is filled and some pharmacies may offer cheaper prices.  The website www.goodrx.com contains coupons for medications through different pharmacies. The prices here do not account for what the cost may be with help from insurance (it may be cheaper with your insurance), but the website can give you the price if you did not use any insurance.  - You can print the associated coupon and take it with your prescription to the pharmacy.  - You may also stop by our office during regular business hours and pick up a GoodRx coupon card.  - If you need your prescription sent electronically to a different pharmacy, notify our office through [facility] or by phone at [phone] option 4.     Si Usted Necesita Algo Despus de Su Visita  Tambin puede enviarnos un mensaje a travs  de Clinical cytogeneticist. Por lo general respondemos a los mensajes de MyChart en el transcurso de 1 a 2 das hbiles.  Para renovar recetas, por favor pida a su farmacia que se ponga en contacto con nuestra oficina. Randi lakes de fax es [place] [phone].  Si tiene un asunto urgente cuando la clnica est cerrada y que no puede esperar hasta el siguiente da hbil, puede llamar/localizar a su doctor(a) al nmero que aparece a continuacin.   Por favor, tenga en cuenta que aunque hacemos todo lo posible para estar disponibles para asuntos urgentes fuera del horario de [place], no estamos disponibles las 24 horas del da, los 7 [address]  [address] de la [place].   Si tiene un problema urgente y no puede comunicarse con nosotros, puede optar por buscar atencin mdica  en el consultorio de su doctor(a), en una clnica privada, en un centro de atencin urgente o en una sala de emergencias.  Si tiene Engineer, drilling, por favor llame inmediatamente al 911 o vaya a la sala de emergencias.  Nmeros  de bper  - Dr. Hester: [phone]  - Dra. Jackquline: [phone]  - Dr. Claudene: [phone]  - Dra. Kitts: [phone]  En caso de inclemencias del [place], por favor llame a nuestra lnea principal al [phone] para una actualizacin sobre el estado de cualquier retraso o cierre.  Consejos para la medicacin en dermatologa: Por favor, guarde las cajas en las que vienen los medicamentos de uso tpico para ayudarle a seguir las instrucciones sobre dnde y cmo usarlos. Las farmacias generalmente imprimen las instrucciones del medicamento slo en las cajas y no directamente en los tubos del [place].   Si su medicamento es muy caro, por favor, pngase en contacto con landry rieger llamando al [phone] y presione la opcin 4 o envenos un mensaje a travs de Clinical cytogeneticist.   No podemos decirle cul ser su copago por los medicamentos por adelantado ya que esto es diferente dependiendo de la cobertura de su seguro.  Sin embargo, es posible que podamos encontrar un medicamento sustituto a Audiological scientist un formulario para que el seguro cubra el medicamento que se considera necesario.   Si se requiere una autorizacin previa para que su compaa de seguros malta su medicamento, por favor permtanos de 1 a 2 das hbiles para completar este proceso.  Los precios de los medicamentos varan con frecuencia dependiendo del Environmental consultant de dnde se surte la receta y alguna farmacias pueden ofrecer precios ms baratos.  El sitio web www.goodrx.com tiene cupones para medicamentos de Health and safety inspector. Los precios aqu no tienen en cuenta lo que podra costar con la ayuda del seguro (puede ser ms barato con su seguro), pero el sitio web puede darle el precio si no utiliz Tourist information centre manager.  - Puede imprimir el cupn correspondiente y llevarlo con su receta a la farmacia.  - Tambin puede pasar por nuestra oficina durante el horario de atencin regular y Education officer, museum una tarjeta de cupones de GoodRx.  - Si necesita que su receta se enve electrnicamente a una farmacia diferente, informe a nuestra oficina a travs de MyChart de [place] o por telfono llamando al [phone] y presione la opcin 4.

## 2024-04-12 NOTE — Progress Notes (Signed)
 Follow-Up Visit   Subjective  Logan Wise is a 63 y.o. male who presents for the following: Atopic Dermatitis 2 week follow up on atopic dermatitis  Patient on dupixent  Patient reports has improved with little to no itch No flared today   Has a few spots on right arm he would like checked.   The following portions of the chart were reviewed this encounter and updated as appropriate: medications, allergies, medical history  Review of Systems:  No other skin or systemic complaints except as noted in HPI or Assessment and Plan.  Objective  Well appearing patient in no apparent distress; mood and affect are within normal limits.  Areas Examined: B/l arms, left thigh   Relevant physical exam findings are noted in the Assessment and Plan.  b/l arms x 7 (7) Erythematous stuck-on, waxy papule or plaque  Assessment & Plan   INFLAMED SEBORRHEIC KERATOSIS (7) b/l arms x 7 (7) Symptomatic, irritating, patient would like treated. Destruction of lesion - b/l arms x 7 (7) Complexity: simple   Destruction method: cryotherapy   Informed consent: discussed and consent obtained   Timeout:  patient name, date of birth, surgical site, and procedure verified Lesion destroyed using liquid nitrogen: Yes   Region frozen until ice ball extended beyond lesion: Yes   Outcome: patient tolerated procedure well with no complications   Post-procedure details: wound care instructions given    ATOPIC DERMATITIS, UNSPECIFIED TYPE   Related Medications dupilumab (DUPIXENT) prefilled syringe 300 mg   ATOPIC DERMATITIS With Severe Pruritus and Excoriations for many months  Hx of Waking up at night itching History of seasonal allergies and peanut allergy Not improved with oral Antihistamines  - has improved since he started on Dupixent 2 weeks ago  Exam: Scattered erythematous excoriations at upper back. +Dermatographism.  35% BSA Chronic and persistent condition with duration or expected  duration over one year. Condition is improving with treatment but not currently at goal. Atopic dermatitis (eczema) is a chronic, relapsing, pruritic condition that can significantly affect quality of life. It is often associated with allergic rhinitis and/or asthma and can require treatment with topical medications, phototherapy, or in severe cases biologic injectable medication (Dupixent; Adbry) or Oral JAK inhibitors.   Treatment Plan:  Today injected Dupixent 300mg /53ml sq into left anterior thigh, patient tolerated well with no adverse reactions. Lot [id number] 10/2025 [facility] [id number]  Patient's paper work for Dupixent was sent and has been approved for Dupixent [facility] - see media  Patient would like to self inject every two weeks. Patient given information to contact [facility] about prescription submitted.  Patient will return in 2 weeks for a nurse visit injection.   Will follow up with patient in 6 months   Dupilumab (Dupixent) is a treatment given by injection for adults and children with moderate-to-severe atopic dermatitis. Goal is control of skin condition, not cure. It is given as 2 injections at the first dose followed by 1 injection every 2 weeks thereafter.  Young children are dosed monthly.  Potential side effects include allergic reaction, herpes infections, injection site reactions and conjunctivitis (inflammation of the eyes).  The use of Dupixent requires long term medication management, including periodic office visits.  Recommend gentle skin care.  SEBORRHEIC KERATOSIS - Stuck-on, waxy, tan-brown papules and/or plaques  - Benign-appearing - Discussed benign etiology and prognosis. - Observe - Call for any changes   ACTINIC DAMAGE - chronic, secondary to cumulative UV radiation exposure/sun exposure over time - diffuse  scaly erythematous macules with underlying dyspigmentation - Recommend daily broad spectrum sunscreen SPF 30+ to sun-exposed areas, reapply every 2  hours as needed.  - Recommend staying in the shade or wearing long sleeves, sun glasses (UVA+UVB protection) and wide brim hats (4-inch brim around the entire circumference of the hat). - Call for new or changing lesions.   Return for 2 week dupixent injection nurse visit , 6 month atopic derm follow up.  IEleanor Blush, CMA, am acting as scribe for Alm Rhyme, MD.   Documentation: I have reviewed the above documentation for accuracy and completeness, and I agree with the above.  Alm Rhyme, MD

## 2024-04-26 ENCOUNTER — Ambulatory Visit

## 2024-05-03 ENCOUNTER — Other Ambulatory Visit: Payer: Self-pay

## 2024-05-03 DIAGNOSIS — B009 Herpesviral infection, unspecified: Secondary | ICD-10-CM

## 2024-05-03 MED ORDER — VALACYCLOVIR HCL 1 G PO TABS
ORAL_TABLET | ORAL | 11 refills | Status: AC
Start: 2024-05-03 — End: ?

## 2024-08-01 ENCOUNTER — Other Ambulatory Visit: Payer: Self-pay | Admitting: Pulmonary Disease

## 2024-10-11 ENCOUNTER — Ambulatory Visit: Admitting: Dermatology
